# Patient Record
Sex: Female | Born: 1975 | Race: White | Hispanic: No | Marital: Married | Smoking: Former smoker
Health system: Southern US, Community
[De-identification: ages and names within clinical notes are randomized; demographics above are authoritative.]

## PROBLEM LIST (undated history)

## (undated) DIAGNOSIS — R51 Headache: Secondary | ICD-10-CM

## (undated) DIAGNOSIS — F32A Depression, unspecified: Secondary | ICD-10-CM

## (undated) DIAGNOSIS — E669 Obesity, unspecified: Secondary | ICD-10-CM

## (undated) DIAGNOSIS — F419 Anxiety disorder, unspecified: Secondary | ICD-10-CM

## (undated) DIAGNOSIS — N390 Urinary tract infection, site not specified: Secondary | ICD-10-CM

## (undated) DIAGNOSIS — K589 Irritable bowel syndrome without diarrhea: Secondary | ICD-10-CM

## (undated) DIAGNOSIS — F329 Major depressive disorder, single episode, unspecified: Secondary | ICD-10-CM

## (undated) DIAGNOSIS — M5431 Sciatica, right side: Secondary | ICD-10-CM

## (undated) HISTORY — DX: Irritable bowel syndrome, unspecified: K58.9

## (undated) HISTORY — DX: Sciatica, right side: M54.31

## (undated) HISTORY — PX: URETHRAL DILATION: SUR417

## (undated) HISTORY — DX: Depression, unspecified: F32.A

## (undated) HISTORY — DX: Urinary tract infection, site not specified: N39.0

## (undated) HISTORY — DX: Major depressive disorder, single episode, unspecified: F32.9

## (undated) HISTORY — DX: Obesity, unspecified: E66.9

## (undated) HISTORY — PX: CYSTO: SHX6284

---

## 2005-10-07 ENCOUNTER — Emergency Department (HOSPITAL_COMMUNITY): Admission: EM | Admit: 2005-10-07 | Discharge: 2005-10-08 | Payer: Self-pay | Admitting: Emergency Medicine

## 2010-02-13 ENCOUNTER — Ambulatory Visit (HOSPITAL_COMMUNITY): Admission: RE | Admit: 2010-02-13 | Discharge: 2010-02-13 | Payer: Self-pay | Admitting: Unknown Physician Specialty

## 2010-03-14 ENCOUNTER — Ambulatory Visit (HOSPITAL_COMMUNITY): Admission: RE | Admit: 2010-03-14 | Discharge: 2010-03-14 | Payer: Self-pay | Admitting: Unknown Physician Specialty

## 2010-04-26 ENCOUNTER — Ambulatory Visit (HOSPITAL_COMMUNITY): Admission: RE | Admit: 2010-04-26 | Discharge: 2010-04-26 | Payer: Self-pay | Admitting: Unknown Physician Specialty

## 2010-05-24 ENCOUNTER — Ambulatory Visit (HOSPITAL_COMMUNITY): Admission: RE | Admit: 2010-05-24 | Discharge: 2010-05-24 | Payer: Self-pay | Admitting: Unknown Physician Specialty

## 2011-01-30 ENCOUNTER — Ambulatory Visit: Payer: Self-pay

## 2011-04-19 HISTORY — PX: BACK SURGERY: SHX140

## 2011-04-22 ENCOUNTER — Emergency Department (HOSPITAL_COMMUNITY)
Admission: EM | Admit: 2011-04-22 | Discharge: 2011-04-23 | Discharge status: Against Medical Advice | Payer: 59 | Attending: Emergency Medicine | Admitting: Emergency Medicine

## 2011-04-22 DIAGNOSIS — M549 Dorsalgia, unspecified: Secondary | ICD-10-CM | POA: Insufficient documentation

## 2011-04-23 ENCOUNTER — Emergency Department: Payer: Self-pay | Admitting: Emergency Medicine

## 2011-05-08 ENCOUNTER — Observation Stay: Payer: Self-pay | Admitting: Unknown Physician Specialty

## 2013-03-22 ENCOUNTER — Encounter: Payer: Self-pay | Admitting: *Deleted

## 2013-03-23 ENCOUNTER — Encounter: Payer: Self-pay | Admitting: Family Medicine

## 2013-03-23 ENCOUNTER — Ambulatory Visit (INDEPENDENT_AMBULATORY_CARE_PROVIDER_SITE_OTHER): Payer: No Typology Code available for payment source | Admitting: Family Medicine

## 2013-03-23 VITALS — BP 108/78 | HR 80 | Temp 97.6°F | Wt 208.4 lb

## 2013-03-23 DIAGNOSIS — J36 Peritonsillar abscess: Secondary | ICD-10-CM

## 2013-03-23 DIAGNOSIS — S99921A Unspecified injury of right foot, initial encounter: Secondary | ICD-10-CM

## 2013-03-23 DIAGNOSIS — S99929A Unspecified injury of unspecified foot, initial encounter: Secondary | ICD-10-CM

## 2013-03-23 MED ORDER — CLINDAMYCIN HCL 300 MG PO CAPS: 300.0000 mg | ORAL_CAPSULE | Freq: Three times a day (TID) | ORAL | Status: DC

## 2013-03-23 NOTE — Progress Notes (Signed)
  Subjective:    Patient ID: Tiffany Townsend, female    DOB: September 06, 1976, 37 y.o.   MRN: [id number]  HPI Patient was walking at home and she struck her foot against a piece of furniture causing great toenail on the right side to become dislodged she relates pain discomfort she went to the ER they did not do an x-ray they told her just to clean and watch it. She comes here now because the toe appears to be dislodged in regards to the toenail.  Family history noncontributory Review of Systems  Has never had this problem before.no fevers or vomiting.    Objective:   Physical Exam Calf normal ankle normal foot normal great toe has a lucent toenail that is separated but is attached around the edges and at the base.  I explained to the patient her options she could watch this clean it daily and if it gets infected followup or we could go ahead and remove the toenail I explained how we would do this she consents.     Assessment & Plan:  Toenail removal-this was done understood sterile technique. Nerve block was placed in the great toe. The toenail was removed without difficulty bleeding was controlled a dressing was applied  Toe injury-I. Do not feel the patient needs an x-ray I did recommend clindamycin 300 mg 3 times a day for the next 5 days. Warning signs for infection were discussed followup if ongoing trouble.

## 2013-03-23 NOTE — Patient Instructions (Signed)
Warm soaks once a day Triple antibiotic ointment once daily Tylenol or ibuprofen as needed for pain

## 2013-06-10 ENCOUNTER — Telehealth: Payer: Self-pay | Admitting: Family Medicine

## 2013-06-10 NOTE — Telephone Encounter (Signed)
error 

## 2013-10-20 ENCOUNTER — Ambulatory Visit (INDEPENDENT_AMBULATORY_CARE_PROVIDER_SITE_OTHER): Payer: No Typology Code available for payment source | Admitting: Family Medicine

## 2013-10-20 ENCOUNTER — Encounter: Payer: Self-pay | Admitting: Family Medicine

## 2013-10-20 VITALS — BP 122/80 | Ht 64.0 in | Wt 210.4 lb

## 2013-10-20 DIAGNOSIS — L600 Ingrowing nail: Secondary | ICD-10-CM

## 2013-10-20 MED ORDER — SULFAMETHOXAZOLE-TMP DS 800-160 MG PO TABS: 1.0000 | ORAL_TABLET | Freq: Two times a day (BID) | ORAL | Status: DC

## 2013-10-20 NOTE — Progress Notes (Signed)
   Subjective:    Patient ID: Tiffany Townsend, female    DOB: 09/27/1976, 37 y.o.   MRN: [id number]  HPI  Patient arrives for an infected ingrown toe nail on right foot. Had same toe nail removed about a year ago was just coming back in good. Red, Swollen and very painful.   Tried saltwater, and local soaking  No fever or slight chills.  On further history he injured great toe some time ago. Female never grew back quite the same.  No discharge  Review of Systems No pain elsewhere no fever no headache no chest pain no shortness of breath ROS otherwise negative    Objective:   Physical Exam  Alert lungs clear. Heart regular in rhythm. H&T normal. Great toe positive erythema positive dystrophic nail no obvious discharge      Assessment & Plan:  Cellulitis with dystrophic nail and recurrent symptoms plan discussed at length. Appropriate antibiotics. Podiatry referral. [facility]

## 2014-02-25 ENCOUNTER — Encounter (HOSPITAL_COMMUNITY): Payer: Self-pay | Admitting: Emergency Medicine

## 2014-02-25 ENCOUNTER — Emergency Department (HOSPITAL_COMMUNITY)
Admission: EM | Admit: 2014-02-25 | Discharge: 2014-02-25 | Disposition: A | Discharge status: Home / Self Care | Payer: BC Managed Care – PPO | Attending: Emergency Medicine | Admitting: Emergency Medicine

## 2014-02-25 ENCOUNTER — Emergency Department (HOSPITAL_COMMUNITY): Payer: BC Managed Care – PPO

## 2014-02-25 DIAGNOSIS — R0602 Shortness of breath: Secondary | ICD-10-CM | POA: Insufficient documentation

## 2014-02-25 DIAGNOSIS — Z87891 Personal history of nicotine dependence: Secondary | ICD-10-CM | POA: Insufficient documentation

## 2014-02-25 DIAGNOSIS — R109 Unspecified abdominal pain: Secondary | ICD-10-CM

## 2014-02-25 DIAGNOSIS — Z79899 Other long term (current) drug therapy: Secondary | ICD-10-CM | POA: Insufficient documentation

## 2014-02-25 DIAGNOSIS — R1013 Epigastric pain: Secondary | ICD-10-CM | POA: Insufficient documentation

## 2014-02-25 DIAGNOSIS — R112 Nausea with vomiting, unspecified: Secondary | ICD-10-CM | POA: Insufficient documentation

## 2014-02-25 LAB — BASIC METABOLIC PANEL
BUN: 14 mg/dL (ref 6–23)
CO2: 23 mEq/L (ref 19–32)
Calcium: 10.2 mg/dL (ref 8.4–10.5)
Chloride: 100 mEq/L (ref 96–112)
Creatinine, Ser: 1.07 mg/dL (ref 0.50–1.10)
GFR calc Af Amer: 76 mL/min — ABNORMAL LOW (ref >90)
GFR, EST NON AFRICAN AMERICAN: 65 mL/min — AB (ref >90)
Glucose, Bld: 112 mg/dL — ABNORMAL HIGH (ref 70–99)
Potassium: 4.6 mEq/L (ref 3.7–5.3)
SODIUM: 137 meq/L (ref 137–147)

## 2014-02-25 LAB — HEPATIC FUNCTION PANEL
ALK PHOS: 59 U/L (ref 39–117)
ALT: 17 U/L (ref 0–35)
AST: 16 U/L (ref 0–37)
Albumin: 3.9 g/dL (ref 3.5–5.2)
Total Bilirubin: 0.7 mg/dL (ref 0.3–1.2)
Total Protein: 7.2 g/dL (ref 6.0–8.3)

## 2014-02-25 LAB — CBC
HCT: 42.5 % (ref 36.0–46.0)
Hemoglobin: 14.7 g/dL (ref 12.0–15.0)
MCH: 29.7 pg (ref 26.0–34.0)
MCHC: 34.6 g/dL (ref 30.0–36.0)
MCV: 85.9 fL (ref 78.0–100.0)
Platelets: 226 10*3/uL (ref 150–400)
RBC: 4.95 MIL/uL (ref 3.87–5.11)
RDW: 13.4 % (ref 11.5–15.5)
WBC: 8 10*3/uL (ref 4.0–10.5)

## 2014-02-25 LAB — TROPONIN I
Troponin I: <0.3 ng/mL (ref <0.30)
Troponin I: <0.3 ng/mL (ref <0.30)

## 2014-02-25 LAB — D-DIMER, QUANTITATIVE: D-Dimer, Quant: 0.39 ug/mL-FEU (ref 0.00–0.48)

## 2014-02-25 LAB — LIPASE, BLOOD: Lipase: 40 U/L (ref 11–59)

## 2014-02-25 MED ORDER — PANTOPRAZOLE SODIUM 20 MG PO TBEC: 20.0000 mg | DELAYED_RELEASE_TABLET | Freq: Every day | ORAL | Status: DC

## 2014-02-25 MED ORDER — GI COCKTAIL ~~LOC~~
30.0000 mL | Freq: Once | ORAL | Status: AC
  Administered 2014-02-25: 30 mL via ORAL
  Filled 2014-02-25: qty 30

## 2014-02-25 MED ORDER — OXYCODONE-ACETAMINOPHEN 5-325 MG PO TABS: 1.0000 | ORAL_TABLET | Freq: Four times a day (QID) | ORAL | Status: DC | PRN

## 2014-02-25 MED ORDER — PANTOPRAZOLE SODIUM 40 MG IV SOLR
40.0000 mg | Freq: Once | INTRAVENOUS | Status: AC
  Administered 2014-02-25: 40 mg via INTRAVENOUS
  Filled 2014-02-25: qty 40

## 2014-02-25 MED ORDER — MORPHINE SULFATE 4 MG/ML IJ SOLN
4.0000 mg | Freq: Once | INTRAMUSCULAR | Status: AC
  Administered 2014-02-25: 4 mg via INTRAVENOUS
  Filled 2014-02-25: qty 1

## 2014-02-25 MED ORDER — ONDANSETRON HCL 4 MG/2ML IJ SOLN
4.0000 mg | Freq: Once | INTRAMUSCULAR | Status: AC
  Administered 2014-02-25: 4 mg via INTRAVENOUS
  Filled 2014-02-25: qty 2

## 2014-02-25 MED ORDER — HYDROMORPHONE HCL PF 1 MG/ML IJ SOLN
1.0000 mg | Freq: Once | INTRAMUSCULAR | Status: AC
  Administered 2014-02-25: 1 mg via INTRAVENOUS
  Filled 2014-02-25: qty 1

## 2014-02-25 MED ORDER — ONDANSETRON 4 MG PO TBDP: ORAL_TABLET | ORAL | Status: DC

## 2014-02-25 NOTE — ED Notes (Signed)
Pt reports that her pain is coming back.  Notified edp

## 2014-02-25 NOTE — ED Provider Notes (Signed)
CSN: [id number]     Arrival date & time 02/25/14  0233 History  This chart was scribed for Tiffany Diego, MD by Ludger Nutting, ED Scribe. This patient was seen in room APA18/AP[facility]18 and the patient's care was started 7:35 AM.    Chief Complaint  Patient presents with  . Chest Pain  . Shortness of Breath      Patient is a 38 y.o. female presenting with chest pain. The history is provided by the patient. No language interpreter was used.  Chest Pain Pain location:  Epigastric Pain quality: sharp   Pain radiates to:  Does not radiate Pain radiates to the back: no   Pain severity:  Moderate Onset quality:  Sudden Duration:  5 hours Timing:  Constant Progression:  Unchanged Relieved by:  Rest Associated symptoms: nausea, shortness of breath and vomiting   Associated symptoms: no abdominal pain, no back pain and no fatigue     HPI Comments: JIM PHILEMON is a 38 y.o. female who presents to the Emergency Department complaining of sudden onset, constant, unchanged mid chest pain that began 4.5 hours ago. She reports associated SOB, nausea, 5-6 episodes of vomiting. She describes the chest pain as sharp. She denies abdominal pain.   History reviewed. No pertinent past medical history. Past Surgical History  Procedure Laterality Date  . Back surgery  04/2011   No family history on file. History  Substance Use Topics  . Smoking status: Former Research scientist (life sciences)  . Smokeless tobacco: Not on file  . Alcohol Use: No   OB History   Grav Para Term Preterm Abortions TAB SAB Ect Mult Living                 Review of Systems  Constitutional: Negative for appetite change and fatigue.  HENT: Negative for congestion, ear discharge and sinus pressure.   Eyes: Negative for discharge.  Respiratory: Positive for shortness of breath.   Cardiovascular: Positive for chest pain.  Gastrointestinal: Positive for nausea and vomiting. Negative for abdominal pain and diarrhea.  Genitourinary: Negative for  frequency and hematuria.  Musculoskeletal: Negative for back pain.  Neurological: Negative for seizures.  Psychiatric/Behavioral: Negative for hallucinations.      Allergies  Codeine  Home Medications   Current Outpatient Rx  Name  Route  Sig  Dispense  Refill  . sulfamethoxazole-trimethoprim (BACTRIM DS) 800-160 MG per tablet   Oral   Take 1 tablet by mouth 2 (two) times daily.   20 tablet   0    BP 140/83  Pulse 104  Temp(Src) 97.8 F (36.6 C) (Oral)  Resp 24  Ht 5\' 4"  (1.626 m)  Wt 215 lb (97.523 kg)  BMI 36.89 kg/m2  SpO2 100%  LMP 02/11/2014 Physical Exam  Nursing note and vitals reviewed. Constitutional: She is oriented to person, place, and time. She appears well-developed.  HENT:  Head: Normocephalic.  Eyes: Conjunctivae and EOM are normal. No scleral icterus.  Neck: Neck supple. No thyromegaly present.  Cardiovascular: Normal rate, regular rhythm and normal heart sounds.  Exam reveals no gallop and no friction rub.   No murmur heard. Pulmonary/Chest: Effort normal and breath sounds normal. No stridor. No respiratory distress. She has no wheezes. She has no rales. She exhibits no tenderness.  Abdominal: She exhibits no distension. There is no tenderness. There is no rebound.  Musculoskeletal: Normal range of motion. She exhibits no edema.  Lymphadenopathy:    She has no cervical adenopathy.  Neurological: She is oriented  to person, place, and time. She exhibits normal muscle tone. Coordination normal.  Skin: No rash noted. No erythema.  Psychiatric: She has a normal mood and affect. Her behavior is normal.    ED Course  Procedures (including critical care time) DIAGNOSTIC STUDIES: Oxygen Saturation is 100% on RA, normal by my interpretation.    COORDINATION OF CARE: 7:36 AM Discussed treatment plan with pt at bedside and pt agreed to plan.   Medications  gi cocktail (Maalox,Lidocaine,Donnatal) (not administered)  HYDROmorphone (DILAUDID) injection  1 mg (1 mg Intravenous Given 02/25/14 0753)  ondansetron (ZOFRAN) injection 4 mg (4 mg Intravenous Given 02/25/14 0753)  pantoprazole (PROTONIX) injection 40 mg (40 mg Intravenous Given 02/25/14 0753)  morphine 4 MG/ML injection 4 mg (4 mg Intravenous Given 02/25/14 0858)      Labs Review Labs Reviewed  BASIC METABOLIC PANEL - Abnormal; Notable for the following:    Glucose, Bld 112 (*)    GFR calc non Af Amer 65 (*)    GFR calc Af Amer 76 (*)    All other components within normal limits  CBC  TROPONIN I  LIPASE, BLOOD   Imaging Review Dg Chest 2 View  02/25/2014   CLINICAL DATA:  Shortness of breath and chest pain  EXAM: CHEST  2 VIEW  COMPARISON:  None.  FINDINGS: The heart size and mediastinal contours are within normal limits. Both lungs are clear. The visualized skeletal structures are unremarkable.  IMPRESSION: No active cardiopulmonary disease.   Electronically Signed   By: Inez Catalina M.D.   On: 02/25/2014 07:19     EKG Interpretation   Date/Time:  Friday February 25 2014 06:36:56 EDT Ventricular Rate:  103 PR Interval:  146 QRS Duration: 90 QT Interval:  346 QTC Calculation: 453 R Axis:   -46 Text Interpretation:  Sinus tachycardia Left axis deviation Pulmonary  disease pattern Abnormal ECG No previous ECGs available Confirmed by  CAMPOS  MD, Lennette Bihari ([id number]) on 02/25/2014 6:44:27 AM      MDM   Final diagnoses:  None   The chart was scribed for me under my direct supervision.  I personally performed the history, physical, and medical decision making and all procedures in the evaluation of this patient.Tiffany Diego, MD 02/25/14 [phone]

## 2014-02-25 NOTE — ED Notes (Signed)
Pt awoke from sleep with sharp mid sternal chest pain approx 3 am.  Pt also reports sob with same

## 2014-02-25 NOTE — ED Notes (Signed)
Pt c/o pain 8/10.  Notified edp

## 2014-02-25 NOTE — Discharge Instructions (Signed)
Follow up with your family md next week. °

## 2014-02-28 ENCOUNTER — Ambulatory Visit (INDEPENDENT_AMBULATORY_CARE_PROVIDER_SITE_OTHER): Payer: BC Managed Care – PPO | Admitting: Family Medicine

## 2014-02-28 ENCOUNTER — Encounter: Payer: Self-pay | Admitting: Family Medicine

## 2014-02-28 VITALS — BP 112/80 | Temp 98.7°F | Ht 64.0 in | Wt 212.0 lb

## 2014-02-28 DIAGNOSIS — K810 Acute cholecystitis: Secondary | ICD-10-CM

## 2014-02-28 NOTE — Progress Notes (Signed)
   Subjective:    Patient ID: Tiffany Townsend, female    DOB: [birth year], 38 y.o.   MRN: [id number]  HPI Patient was seen at [facility] ER on Friday for chest pain. EKG was normal. Ultrasound was done and gallstones was found. Possible surgical intervention. No other concerns at this time.   Patient relates pain awoke her from her sleep she described as being epigastric pain and chest pain she denies any sweats chills nausea vomiting she went to the ER they did a thorough workup showed gallstones they told her to followup here patient still hurting over the weekend with epigastric chest pain and upper abdominal pain she relates having difficult time eating and drinking because of this she is using oxycodone but is not using her Zofran, occasional vomiting, she is urinating she is having some stools Review of Systems ER labs EKG ultrasound all reviewed and Patient positive for nausea upper abdominal pain see above    Objective:   Physical Exam  Lungs clear hearts regular flanks nontender abdomen moderate mid abdomen right upper quadrant and epigastric tenderness and discomfort. No guarding or rebound     Assessment & Plan:  I discussed the case with Dr. Arnoldo Morale. He will see her tomorrow morning at his office. She will continue oxycodone for pain cautioned drowsiness I encourage her to use Zofran for the nausea stick with a bland diet she will probably have surgery later this week if she starts having bilious vomiting high fevers severe pain or worse go to the ER

## 2014-03-01 ENCOUNTER — Encounter (HOSPITAL_COMMUNITY): Payer: Self-pay

## 2014-03-01 ENCOUNTER — Encounter (HOSPITAL_COMMUNITY)
Admission: RE | Admit: 2014-03-01 | Discharge: 2014-03-01 | Disposition: A | Discharge status: Home / Self Care | Payer: BC Managed Care – PPO | Source: Ambulatory Visit | Attending: General Surgery | Admitting: General Surgery

## 2014-03-01 NOTE — Pre-Procedure Instructions (Signed)
Preop phone call

## 2014-03-02 ENCOUNTER — Encounter (HOSPITAL_COMMUNITY): Payer: BC Managed Care – PPO | Admitting: Anesthesiology

## 2014-03-02 ENCOUNTER — Encounter (HOSPITAL_COMMUNITY)
Admission: RE | Disposition: A | Discharge status: Home / Self Care | Payer: Self-pay | Source: Ambulatory Visit | Attending: General Surgery

## 2014-03-02 ENCOUNTER — Ambulatory Visit (HOSPITAL_COMMUNITY)
Admission: RE | Admit: 2014-03-02 | Discharge: 2014-03-02 | Disposition: A | Discharge status: Home / Self Care | Payer: BC Managed Care – PPO | Source: Ambulatory Visit | Attending: General Surgery | Admitting: General Surgery

## 2014-03-02 ENCOUNTER — Encounter (HOSPITAL_COMMUNITY): Payer: Self-pay | Admitting: *Deleted

## 2014-03-02 ENCOUNTER — Ambulatory Visit (HOSPITAL_COMMUNITY): Payer: BC Managed Care – PPO | Admitting: Anesthesiology

## 2014-03-02 DIAGNOSIS — Z87891 Personal history of nicotine dependence: Secondary | ICD-10-CM | POA: Insufficient documentation

## 2014-03-02 DIAGNOSIS — K821 Hydrops of gallbladder: Secondary | ICD-10-CM | POA: Insufficient documentation

## 2014-03-02 DIAGNOSIS — K8010 Calculus of gallbladder with chronic cholecystitis without obstruction: Secondary | ICD-10-CM | POA: Insufficient documentation

## 2014-03-02 HISTORY — PX: CHOLECYSTECTOMY: SHX55

## 2014-03-02 HISTORY — DX: Headache: R51

## 2014-03-02 HISTORY — DX: Anxiety disorder, unspecified: F41.9

## 2014-03-02 LAB — PREGNANCY, URINE: Preg Test, Ur: NEGATIVE

## 2014-03-02 SURGERY — LAPAROSCOPIC CHOLECYSTECTOMY
Anesthesia: General | Site: Abdomen

## 2014-03-02 MED ORDER — LACTATED RINGERS IV SOLN
INTRAVENOUS | Status: DC
  Administered 2014-03-02 (×2): via INTRAVENOUS

## 2014-03-02 MED ORDER — GLYCOPYRROLATE 0.2 MG/ML IJ SOLN
INTRAMUSCULAR | Status: AC
  Filled 2014-03-02: qty 2

## 2014-03-02 MED ORDER — MIDAZOLAM HCL 2 MG/2ML IJ SOLN
INTRAMUSCULAR | Status: AC
  Filled 2014-03-02: qty 2

## 2014-03-02 MED ORDER — MIDAZOLAM HCL 2 MG/2ML IJ SOLN
1.0000 mg | INTRAMUSCULAR | Status: AC | PRN
  Administered 2014-03-02 (×3): 2 mg via INTRAVENOUS

## 2014-03-02 MED ORDER — PROPOFOL 10 MG/ML IV BOLUS
INTRAVENOUS | Status: DC | PRN
  Administered 2014-03-02: 150 mg via INTRAVENOUS
  Administered 2014-03-02 (×3): 50 mg via INTRAVENOUS

## 2014-03-02 MED ORDER — ONDANSETRON HCL 4 MG/2ML IJ SOLN
4.0000 mg | Freq: Once | INTRAMUSCULAR | Status: AC
  Administered 2014-03-02: 4 mg via INTRAVENOUS

## 2014-03-02 MED ORDER — GLYCOPYRROLATE 0.2 MG/ML IJ SOLN
INTRAMUSCULAR | Status: AC
  Filled 2014-03-02: qty 1

## 2014-03-02 MED ORDER — BUPIVACAINE HCL (PF) 0.5 % IJ SOLN
INTRAMUSCULAR | Status: AC
  Filled 2014-03-02: qty 30

## 2014-03-02 MED ORDER — KETOROLAC TROMETHAMINE 30 MG/ML IJ SOLN
30.0000 mg | Freq: Once | INTRAMUSCULAR | Status: AC
  Administered 2014-03-02: 30 mg via INTRAVENOUS
  Filled 2014-03-02: qty 1

## 2014-03-02 MED ORDER — NEOSTIGMINE METHYLSULFATE 1 MG/ML IJ SOLN
INTRAMUSCULAR | Status: DC | PRN
  Administered 2014-03-02: 2 mg via INTRAVENOUS
  Administered 2014-03-02: 1 mg via INTRAVENOUS

## 2014-03-02 MED ORDER — ONDANSETRON HCL 4 MG/2ML IJ SOLN: 4.0000 mg | Freq: Once | INTRAMUSCULAR | Status: DC | PRN

## 2014-03-02 MED ORDER — CIPROFLOXACIN IN D5W 400 MG/200ML IV SOLN
INTRAVENOUS | Status: AC
  Filled 2014-03-02: qty 200

## 2014-03-02 MED ORDER — FENTANYL CITRATE 0.05 MG/ML IJ SOLN
INTRAMUSCULAR | Status: AC
Start: 2014-03-02 — End: 2014-03-02
  Filled 2014-03-02: qty 5

## 2014-03-02 MED ORDER — LIDOCAINE HCL (CARDIAC) 10 MG/ML IV SOLN
INTRAVENOUS | Status: DC | PRN
  Administered 2014-03-02: 20 mg via INTRAVENOUS

## 2014-03-02 MED ORDER — ONDANSETRON HCL 4 MG/2ML IJ SOLN
INTRAMUSCULAR | Status: AC
  Filled 2014-03-02: qty 2

## 2014-03-02 MED ORDER — POVIDONE-IODINE 10 % OINT PACKET
TOPICAL_OINTMENT | CUTANEOUS | Status: DC | PRN
  Administered 2014-03-02: 1 via TOPICAL

## 2014-03-02 MED ORDER — LIDOCAINE HCL (PF) 1 % IJ SOLN
INTRAMUSCULAR | Status: AC
  Filled 2014-03-02: qty 5

## 2014-03-02 MED ORDER — PROPOFOL 10 MG/ML IV EMUL
INTRAVENOUS | Status: AC
  Filled 2014-03-02: qty 20

## 2014-03-02 MED ORDER — BUPIVACAINE HCL (PF) 0.5 % IJ SOLN
INTRAMUSCULAR | Status: DC | PRN
  Administered 2014-03-02: 10 mL

## 2014-03-02 MED ORDER — ROCURONIUM BROMIDE 100 MG/10ML IV SOLN
INTRAVENOUS | Status: DC | PRN
  Administered 2014-03-02: 40 mg via INTRAVENOUS

## 2014-03-02 MED ORDER — GLYCOPYRROLATE 0.2 MG/ML IJ SOLN
0.2000 mg | Freq: Once | INTRAMUSCULAR | Status: AC
  Administered 2014-03-02: 0.2 mg via INTRAVENOUS

## 2014-03-02 MED ORDER — POVIDONE-IODINE 10 % EX OINT
TOPICAL_OINTMENT | CUTANEOUS | Status: AC
  Filled 2014-03-02: qty 1

## 2014-03-02 MED ORDER — HYDROCODONE-ACETAMINOPHEN 5-325 MG PO TABS: 1.0000 | ORAL_TABLET | Freq: Four times a day (QID) | ORAL | Status: DC | PRN

## 2014-03-02 MED ORDER — FENTANYL CITRATE 0.05 MG/ML IJ SOLN
INTRAMUSCULAR | Status: AC
  Filled 2014-03-02: qty 5

## 2014-03-02 MED ORDER — FENTANYL CITRATE 0.05 MG/ML IJ SOLN
25.0000 ug | INTRAMUSCULAR | Status: DC | PRN
  Administered 2014-03-02 (×2): 50 ug via INTRAVENOUS
  Filled 2014-03-02: qty 2

## 2014-03-02 MED ORDER — GLYCOPYRROLATE 0.2 MG/ML IJ SOLN
INTRAMUSCULAR | Status: DC | PRN
  Administered 2014-03-02: 0.4 mg via INTRAVENOUS
  Administered 2014-03-02: 0.2 mg via INTRAVENOUS

## 2014-03-02 MED ORDER — HEMOSTATIC AGENTS (NO CHARGE) OPTIME
TOPICAL | Status: DC | PRN
  Administered 2014-03-02: 2 via TOPICAL

## 2014-03-02 MED ORDER — FENTANYL CITRATE 0.05 MG/ML IJ SOLN
INTRAMUSCULAR | Status: DC | PRN
  Administered 2014-03-02: 100 ug via INTRAVENOUS
  Administered 2014-03-02 (×2): 50 ug via INTRAVENOUS
  Administered 2014-03-02: 100 ug via INTRAVENOUS
  Administered 2014-03-02 (×3): 50 ug via INTRAVENOUS

## 2014-03-02 MED ORDER — SODIUM CHLORIDE 0.9 % IR SOLN
Status: DC | PRN
  Administered 2014-03-02: 1000 mL

## 2014-03-02 MED ORDER — CIPROFLOXACIN IN D5W 400 MG/200ML IV SOLN
400.0000 mg | INTRAVENOUS | Status: AC
  Administered 2014-03-02: 400 mg via INTRAVENOUS

## 2014-03-02 MED ORDER — ROCURONIUM BROMIDE 50 MG/5ML IV SOLN
INTRAVENOUS | Status: AC
  Filled 2014-03-02: qty 1

## 2014-03-02 SURGICAL SUPPLY — 43 items
APPLIER CLIP LAPSCP 10X32 DD (CLIP) ×3 IMPLANT
BAG HAMPER (MISCELLANEOUS) ×3 IMPLANT
CLOTH BEACON ORANGE TIMEOUT ST (SAFETY) ×3 IMPLANT
COVER LIGHT HANDLE STERIS (MISCELLANEOUS) ×6 IMPLANT
DECANTER SPIKE VIAL GLASS SM (MISCELLANEOUS) ×3 IMPLANT
DURAPREP 26ML APPLICATOR (WOUND CARE) ×3 IMPLANT
ELECT REM PT RETURN 9FT ADLT (ELECTROSURGICAL) ×3
ELECTRODE REM PT RTRN 9FT ADLT (ELECTROSURGICAL) ×1 IMPLANT
FILTER SMOKE EVAC LAPAROSHD (FILTER) ×3 IMPLANT
FORMALIN 10 PREFIL 120ML (MISCELLANEOUS) ×3 IMPLANT
GLOVE BIO SURGEON STRL SZ7.5 (GLOVE) ×3 IMPLANT
GLOVE BIOGEL PI IND STRL 7.0 (GLOVE) ×2 IMPLANT
GLOVE BIOGEL PI IND STRL 8 (GLOVE) ×1 IMPLANT
GLOVE BIOGEL PI INDICATOR 7.0 (GLOVE) ×4
GLOVE BIOGEL PI INDICATOR 8 (GLOVE) ×2
GLOVE ECLIPSE 6.5 STRL STRAW (GLOVE) ×3 IMPLANT
GLOVE ECLIPSE 7.0 STRL STRAW (GLOVE) ×3 IMPLANT
GLOVE EXAM NITRILE LRG STRL (GLOVE) ×3 IMPLANT
GOWN STRL REUS W/TWL LRG LVL3 (GOWN DISPOSABLE) ×9 IMPLANT
HEMOSTAT SNOW SURGICEL 2X4 (HEMOSTASIS) ×6 IMPLANT
INST SET LAPROSCOPIC AP (KITS) ×6 IMPLANT
IV NS IRRIG 3000ML ARTHROMATIC (IV SOLUTION) ×3 IMPLANT
KIT ROOM TURNOVER APOR (KITS) ×3 IMPLANT
MANIFOLD NEPTUNE II (INSTRUMENTS) ×3 IMPLANT
NEEDLE INSUFFLATION 14GA 120MM (NEEDLE) ×3 IMPLANT
NS IRRIG 1000ML POUR BTL (IV SOLUTION) ×3 IMPLANT
PACK LAP CHOLE LZT030E (CUSTOM PROCEDURE TRAY) ×3 IMPLANT
PAD ARMBOARD 7.5X6 YLW CONV (MISCELLANEOUS) ×3 IMPLANT
POUCH SPECIMEN RETRIEVAL 10MM (ENDOMECHANICALS) ×3 IMPLANT
SET BASIN LINEN APH (SET/KITS/TRAYS/PACK) ×3 IMPLANT
SET TUBE IRRIG SUCTION NO TIP (IRRIGATION / IRRIGATOR) ×3 IMPLANT
SLEEVE ENDOPATH XCEL 5M (ENDOMECHANICALS) ×3 IMPLANT
SPONGE GAUZE 2X2 8PLY STER LF (GAUZE/BANDAGES/DRESSINGS) ×4
SPONGE GAUZE 2X2 8PLY STRL LF (GAUZE/BANDAGES/DRESSINGS) ×8 IMPLANT
STAPLER VISISTAT (STAPLE) ×3 IMPLANT
SUT VICRYL 0 UR6 27IN ABS (SUTURE) ×6 IMPLANT
TAPE CLOTH SURG 4X10 WHT LF (GAUZE/BANDAGES/DRESSINGS) ×3 IMPLANT
TROCAR ENDO BLADELESS 11MM (ENDOMECHANICALS) ×3 IMPLANT
TROCAR XCEL NON-BLD 5MMX100MML (ENDOMECHANICALS) ×3 IMPLANT
TROCAR XCEL UNIV SLVE 11M 100M (ENDOMECHANICALS) ×3 IMPLANT
TUBING INSUFFLATION (TUBING) ×3 IMPLANT
WARMER LAPAROSCOPE (MISCELLANEOUS) ×3 IMPLANT
YANKAUER SUCT 12FT TUBE ARGYLE (SUCTIONS) ×3 IMPLANT

## 2014-03-02 NOTE — H&P (Signed)
  NTS SOAP Note  Vital Signs:  Vitals as of: 2/97/9892: Systolic 119: Diastolic 90: Heart Rate 96: Temp 97.17F: Height 3ft 4in: Weight 212Lbs 0 Ounces: Pain Level 5: BMI 36.39  BMI : 36.39 kg/m2  Subjective: This 41 Years 16 Months old Female presents for of abdominal pain.  Has been having intermittent right upper quadrant abdominal pain, nausea, and food intolerance since last weekend.  No fever, chills, jaundice.  U/S of gallbladder shows cholelithiasis, normal common bile duct.  Review of Symptoms:  Constitutional:  fatigue    dizzy spells Eyes:unremarkable   Nose/Mouth/Throat:unremarkable Cardiovascular:  unremarkable   Respiratory:unremarkable   Gastrointestin    abdominal pain,nausea,heartburn Genitourinary:    frequency   back pain Skin:unremarkable Hematolgic/Lymphatic:unremarkable     Allergic/Immunologic:unremarkable     Past Medical History:    Reviewed  Past Medical History  Surgical History: back surgery Medical Problems: none Allergies: codeine Medications: zofran, percocet, protonix   Social History:Reviewed  Social History  Preferred Language: English Race:  White Ethnicity: Not Hispanic / Latino Age: 62 Years 8 Months Marital Status:  M Alcohol: no Recreational drug(s): no   Smoking Status: Never smoker reviewed on 03/01/2014 Functional Status reviewed on 03/01/2014 ------------------------------------------------ Bathing: Normal Cooking: Normal Dressing: Normal Driving: Normal Eating: Normal Managing Meds: Normal Oral Care: Normal Shopping: Normal Toileting: Normal Transferring: Normal Walking: Normal Cognitive Status reviewed on 03/01/2014 ------------------------------------------------ Attention: Normal Decision Making: Normal Language: Normal Memory: Normal Motor: Normal Perception: Normal Problem Solving: Normal Visual and Spatial: Normal   Family History:  Reviewed  Family  Health History Mother, Living; Diabetes mellitus, unspecified type;  Father, Living; Healthy;     Objective Information: General:  Well appearing, well nourished in no distress.   no scleral icterus Heart:  RRR, no murmur Lungs:    CTA bilaterally, no wheezes, rhonchi, rales.  Breathing unlabored. Abdomen:Soft, tender in right upper quadrant to palpation, ND, normal bowel sounds, no HSM, no masses.  No peritoneal signs.  Assessment:Cholecystitis, cholelithiasis  Diagnoses: 574.00 Calculus of gallbladder with acute cholecystitis (Calculus of gallbladder with acute cholecystitis without obstruction)  Procedures: 41740 - OFFICE OUTPATIENT NEW 30 MINUTES    Plan:  Scheduled for laparoscopic cholecystectomy on 03/02/14.   Patient Education:Alternative treatments to surgery were discussed with patient (and family).  Risks and benefits  including bleeding, infection, hepatobiliary injury, and the possibility of an open procedure were fully explained to the patient (and family) who gave informed consent. Patient/family questions were addressed.  Follow-up:Pending Surgery

## 2014-03-02 NOTE — Interval H&P Note (Signed)
History and Physical Interval Note:  03/02/2014 10:34 AM  Tiffany Townsend  has presented today for surgery, with the diagnosis of cholelithiasis  The various methods of treatment have been discussed with the patient and family. After consideration of risks, benefits and other options for treatment, the patient has consented to  Procedure(s): LAPAROSCOPIC CHOLECYSTECTOMY (N/A) as a surgical intervention .  The patient's history has been reviewed, patient examined, no change in status, stable for surgery.  I have reviewed the patient's chart and labs.  Questions were answered to the patient's satisfaction.     [name]

## 2014-03-02 NOTE — Anesthesia Procedure Notes (Signed)
Procedure Name: Intubation Date/Time: 03/02/2014 11:36 AM Performed by: Vista Deck Pre-anesthesia Checklist: Patient identified, Patient being monitored, Timeout performed, Emergency Drugs available and Suction available Patient Re-evaluated:Patient Re-evaluated prior to inductionOxygen Delivery Method: Circle System Utilized Preoxygenation: Pre-oxygenation with 100% oxygen Intubation Type: IV induction Ventilation: Mask ventilation without difficulty Laryngoscope Size: 3 and Mac Grade View: Grade I Tube type: Oral Tube size: 7.0 mm Number of attempts: 1 Airway Equipment and Method: stylet Placement Confirmation: ETT inserted through vocal cords under direct vision,  positive ETCO2 and breath sounds checked- equal and bilateral Secured at: 21 cm Tube secured with: Tape Dental Injury: Teeth and Oropharynx as per pre-operative assessment

## 2014-03-02 NOTE — Anesthesia Postprocedure Evaluation (Signed)
Anesthesia Post Note  Patient: Tiffany Townsend  Procedure(s) Performed: Procedure(s) (LRB): LAPAROSCOPIC CHOLECYSTECTOMY (N/A)  Anesthesia type: General  Patient location: PACU  Post pain: Pain level controlled  Post assessment: Post-op Vital signs reviewed, Patient's Cardiovascular Status Stable, Respiratory Function Stable, Patent Airway, No signs of Nausea or vomiting and Pain level controlled  Last Vitals:  Filed Vitals:   03/02/14 1240  BP: 122/76  Pulse: 108  Temp: 36.5 C  Resp: 15    Post vital signs: Reviewed and stable  Level of consciousness: awake and alert   Complications: No apparent anesthesia complications

## 2014-03-02 NOTE — Op Note (Signed)
Patient:  Tiffany Townsend  DOB:  1976/10/24  MRN:  [id number]   Preop Diagnosis:  Cholecystitis, cholelithiasis  Postop Diagnosis:  Same, hydrops of gallbladder  Procedure:  Laparoscopic cholecystectomy  Surgeon:  Aviva Signs, M.D.  Anes:  General endotracheal  Indications:  Patient is a 38 year old white female presents with cholecystitis secondary to cholelithiasis. The risks and benefits of the procedure including bleeding, infection, hepatobiliary injury, and the possibility of an open procedure were fully explained to the patient, who gave informed consent.  Procedure note:  The patient is placed the supine position. After induction of general endotracheal anesthesia, the abdomen was prepped and draped using usual sterile technique with DuraPrep. Surgical site confirmation was performed.  A supraumbilical incision was made down to the fascia. A Veress needle was introduced into the abdominal cavity and confirmation of placement was done using the saline drop test. The abdomen was then insufflated to 16 mm mercury pressure. An 11 mm trocar was introduced into the abdominal cavity under direct visualization without difficulty. The patient was placed in reverse Trendelenburg position and additional 11 mm trocar was placed the epigastric region and 5 mm trochars were placed the right upper quadrant and right flank regions. The liver was inspected and noted to be somewhat boggy in nature. The gallbladder was distended, tense, with a thickened gallbladder wall. In order to expose the infundibulum of the gallbladder, the gallbladder was decompressed with suction. Hydrops of the gallbladder was found. The gallbladder was then retracted in a dynamic fashion in order to expose the triangle of Calot. The cystic duct was first identified. Its juncture to the infundibulum was fully identified. Endoclips were placed proximally and distally on the cystic duct, and the cystic duct was divided. This was  likewise done the cystic artery. The gallbladder was then freed away from the gallbladder fossa using Bovie electrocautery. The gallbladder was delivered through the epigastric trocar site using an Endo Catch bag. The gallbladder fossa was inspected no abnormal bleeding or bile leakage was noted. Surgicel is placed the gallbladder fossa. All fluid and air were then evacuated from the abdominal cavity prior to removal of the trochars.  All wounds were irrigated with normal saline. All wounds were injected with 0.5% Sensorcaine. The suprabuccal fashion as well as epigastric fascia reapproximated using 0 Vicryl interrupted sutures. All skin incisions were closed with staples. Betadine ointment and dry sterile dressings were applied.  All tape and needle counts were correct at the end of the procedure. Patient was extubated in the operating room and transferred to PACU in stable condition.  Complications:  None  EBL:  Less than 50 cc  Specimen:  Gallbladder

## 2014-03-02 NOTE — Transfer of Care (Signed)
Immediate Anesthesia Transfer of Care Note  Patient: Tiffany Townsend  Procedure(s) Performed: Procedure(s) (LRB): LAPAROSCOPIC CHOLECYSTECTOMY (N/A)  Patient Location: PACU  Anesthesia Type: General  Level of Consciousness: awake  Airway & Oxygen Therapy: Patient Spontanous Breathing and non-rebreather face mask  Post-op Assessment: Report given to PACU RN, Post -op Vital signs reviewed and stable and Patient moving all extremities  Post vital signs: Reviewed and stable  Complications: No apparent anesthesia complications

## 2014-03-02 NOTE — Discharge Instructions (Signed)
Laparoscopic Cholecystectomy, Care After °Refer to this sheet in the next few weeks. These instructions provide you with information on caring for yourself after your procedure. Your health care provider may also give you more specific instructions. Your treatment has been planned according to current medical practices, but problems sometimes occur. Call your health care provider if you have any problems or questions after your procedure. °WHAT TO EXPECT AFTER THE PROCEDURE °After your procedure, it is typical to have the following: °· Pain at your incision sites. You will be given pain medicines to control the pain. °· Mild nausea or vomiting. This should improve after the first 24 hours. °· Bloating and possibly shoulder pain from the gas used during the procedure. This will improve after the first 24 hours. °HOME CARE INSTRUCTIONS  °· Change bandages (dressings) as directed by your health care provider. °· Keep the wound dry and clean. You may wash the wound gently with soap and water. Gently blot or dab the area dry. °· Do not take baths or use swimming pools or hot tubs for 2 weeks or until your health care provider approves. °· Only take over-the-counter or prescription medicines as directed by your health care provider. °· Continue your normal diet as directed by your health care provider. °· Do not lift anything heavier than 10 pounds (4.5 kg) until your health care provider approves. °· Do not play contact sports for 1 week or until your health care provider approves. °SEEK MEDICAL CARE IF:  °· You have redness, swelling, or increasing pain in the wound. °· You notice yellowish-white fluid (pus) coming from the wound. °· You have drainage from the wound that lasts longer than 1 day. °· You notice a bad smell coming from the wound or dressing. °· Your surgical cuts (incisions) break open. °SEEK IMMEDIATE MEDICAL CARE IF:  °· You develop a rash. °· You have difficulty breathing. °· You have chest pain. °· You  have a fever. °· You have increasing pain in the shoulders (shoulder strap areas). °· You have dizzy episodes or faint while standing. °· You have severe abdominal pain. °· You feel sick to your stomach (nauseous) or throw up (vomit) and this lasts for more than 1 day. °Document Released: 11/04/2005 Document Revised: 08/25/2013 Document Reviewed: 06/16/2013 °[employer]® Patient Information ©2014 [employer]. ° °

## 2014-03-02 NOTE — Anesthesia Preprocedure Evaluation (Addendum)
Anesthesia Evaluation  Patient identified by MRN, date of birth, ID band Patient awake    Reviewed: Allergy & Precautions, H&P , NPO status , Patient's Chart, lab work & pertinent test results  Airway Mallampati: II TM Distance: >3 FB     Dental  (+) Teeth Intact   Pulmonary former smoker,  breath sounds clear to auscultation        Cardiovascular negative cardio ROS  Rhythm:Regular Rate:Normal     Neuro/Psych  Headaches, Anxiety    GI/Hepatic negative GI ROS,   Endo/Other    Renal/GU      Musculoskeletal   Abdominal   Peds  Hematology   Anesthesia Other Findings   Reproductive/Obstetrics                          Anesthesia Physical Anesthesia Plan  ASA: II  Anesthesia Plan: General   Post-op Pain Management:    Induction: Intravenous, Rapid sequence and Cricoid pressure planned  Airway Management Planned: Oral ETT  Additional Equipment:   Intra-op Plan:   Post-operative Plan: Extubation in OR  Informed Consent:   Plan Discussed with:   Anesthesia Plan Comments:         Anesthesia Quick Evaluation

## 2014-03-03 MED ORDER — SODIUM CHLORIDE 0.9 % IR SOLN
Status: DC | PRN
  Administered 2014-03-02: 3000 mL

## 2014-03-04 ENCOUNTER — Encounter (HOSPITAL_COMMUNITY): Payer: Self-pay | Admitting: General Surgery

## 2015-01-09 ENCOUNTER — Encounter: Payer: Self-pay | Admitting: Family Medicine

## 2015-01-09 ENCOUNTER — Ambulatory Visit (INDEPENDENT_AMBULATORY_CARE_PROVIDER_SITE_OTHER): Payer: BLUE CROSS/BLUE SHIELD | Admitting: Family Medicine

## 2015-01-09 VITALS — BP 134/96 | Ht 64.0 in | Wt 205.0 lb

## 2015-01-09 DIAGNOSIS — F329 Major depressive disorder, single episode, unspecified: Secondary | ICD-10-CM

## 2015-01-09 DIAGNOSIS — F32A Depression, unspecified: Secondary | ICD-10-CM

## 2015-01-09 DIAGNOSIS — R0600 Dyspnea, unspecified: Secondary | ICD-10-CM

## 2015-01-09 MED ORDER — CITALOPRAM HYDROBROMIDE 20 MG PO TABS: 20.0000 mg | ORAL_TABLET | Freq: Every day | ORAL | Status: DC

## 2015-01-09 NOTE — Patient Instructions (Signed)
Celexa ( Citalopram ) the first week take 1/2 tablet daily then 1 per day  Recheck here in 3 weeks   Panic Attacks Panic attacks are sudden, short-livedsurges of severe anxiety, fear, or discomfort. They may occur for no reason when you are relaxed, when you are anxious, or when you are sleeping. Panic attacks may occur for a number of reasons:   Healthy people occasionally have panic attacks in extreme, life-threatening situations, such as war or natural disasters. Normal anxiety is a protective mechanism of the body that helps [place] react to danger (fight or flight response).  Panic attacks are often seen with anxiety disorders, such as panic disorder, social anxiety disorder, generalized anxiety disorder, and phobias. Anxiety disorders cause excessive or uncontrollable anxiety. They may interfere with your relationships or other life activities.  Panic attacks are sometimes seen with other mental illnesses, such as depression and posttraumatic stress disorder.  Certain medical conditions, prescription medicines, and drugs of abuse can cause panic attacks. SYMPTOMS  Panic attacks start suddenly, peak within 20 minutes, and are accompanied by four or more of the following symptoms:  Pounding heart or fast heart rate (palpitations).  Sweating.  Trembling or shaking.  Shortness of breath or feeling smothered.  Feeling choked.  Chest pain or discomfort.  Nausea or strange feeling in your stomach.  Dizziness, light-headedness, or feeling like you will faint.  Chills or hot flushes.  Numbness or tingling in your lips or hands and feet.  Feeling that things are not real or feeling that you are not yourself.  Fear of losing control or going crazy.  Fear of dying. Some of these symptoms can mimic serious medical conditions. For example, you may think you are having a heart attack. Although panic attacks can be very scary, they are not life threatening. DIAGNOSIS  Panic attacks are  diagnosed through an assessment by your health care provider. Your health care provider will ask questions about your symptoms, such as where and when they occurred. Your health care provider will also ask about your medical history and use of alcohol and drugs, including prescription medicines. Your health care provider may order blood tests or other studies to rule out a serious medical condition. Your health care provider may refer you to a mental health professional for further evaluation. TREATMENT   Most healthy people who have one or two panic attacks in an extreme, life-threatening situation will not require treatment.  The treatment for panic attacks associated with anxiety disorders or other mental illness typically involves counseling with a mental health professional, medicine, or a combination of both. Your health care provider will help determine what treatment is best for you.  Panic attacks due to physical illness usually go away with treatment of the illness. If prescription medicine is causing panic attacks, talk with your health care provider about stopping the medicine, decreasing the dose, or substituting another medicine.  Panic attacks due to alcohol or drug abuse go away with abstinence. Some adults need professional help in order to stop drinking or using drugs. HOME CARE INSTRUCTIONS   Take all medicines as directed by your health care provider.   Schedule and attend follow-up visits as directed by your health care provider. It is important to keep all your appointments. SEEK MEDICAL CARE IF:  You are not able to take your medicines as prescribed.  Your symptoms do not improve or get worse. SEEK IMMEDIATE MEDICAL CARE IF:   You experience panic attack symptoms that are different  than your usual symptoms.  You have serious thoughts about hurting yourself or others.  You are taking medicine for panic attacks and have a serious side effect. MAKE SURE  YOU:  Understand these instructions.  Will watch your condition.  Will get help right away if you are not doing well or get worse. Document Released: 11/04/2005 Document Revised: 11/09/2013 Document Reviewed: 06/18/2013 [facility] Patient Information 2015 [place], [place]. This information is not intended to replace advice given to you by your health care provider. Make sure you discuss any questions you have with your health care provider. Depression Depression refers to feeling sad, low, down in the dumps, blue, gloomy, or empty. In general, there are two kinds of depression:  Normal sadness or normal grief. This kind of depression is one that we all feel from time to time after upsetting life experiences, such as the loss of a job or the ending of a relationship. This kind of depression is considered normal, is short lived, and resolves within a few days to 2 weeks. Depression experienced after the loss of a loved one (bereavement) often lasts longer than 2 weeks but normally gets better with time.  Clinical depression. This kind of depression lasts longer than normal sadness or normal grief or interferes with your ability to function at home, at work, and in school. It also interferes with your personal relationships. It affects almost every aspect of your life. Clinical depression is an illness. Symptoms of depression can also be caused by conditions other than those mentioned above, such as:  Physical illness. Some physical illnesses, including underactive thyroid gland (hypothyroidism), severe anemia, specific types of cancer, diabetes, uncontrolled seizures, heart and lung problems, strokes, and chronic pain are commonly associated with symptoms of depression.  Side effects of some prescription medicine. In some people, certain types of medicine can cause symptoms of depression.  Substance abuse. Abuse of alcohol and illicit drugs can cause symptoms of depression. SYMPTOMS Symptoms of  normal sadness and normal grief include the following:  Feeling sad or crying for short periods of time.  Not caring about anything (apathy).  Difficulty sleeping or sleeping too much.  No longer able to enjoy the things you used to enjoy.  Desire to be by oneself all the time (social isolation).  Lack of energy or motivation.  Difficulty concentrating or remembering.  Change in appetite or weight.  Restlessness or agitation. Symptoms of clinical depression include the same symptoms of normal sadness or normal grief and also the following symptoms:  Feeling sad or crying all the time.  Feelings of guilt or worthlessness.  Feelings of hopelessness or helplessness.  Thoughts of suicide or the desire to harm yourself (suicidal ideation).  Loss of touch with reality (psychotic symptoms). Seeing or hearing things that are not real (hallucinations) or having false beliefs about your life or the people around you (delusions and paranoia). DIAGNOSIS  The diagnosis of clinical depression is usually based on how bad the symptoms are and how long they have lasted. Your health care provider will also ask you questions about your medical history and substance use to find out if physical illness, use of prescription medicine, or substance abuse is causing your depression. Your health care provider may also order blood tests. TREATMENT  Often, normal sadness and normal grief do not require treatment. However, sometimes antidepressant medicine is given for bereavement to ease the depressive symptoms until they resolve. The treatment for clinical depression depends on how bad the symptoms are  but often includes antidepressant medicine, counseling with a mental health professional, or both. Your health care provider will help to determine what treatment is best for you. Depression caused by physical illness usually goes away with appropriate medical treatment of the illness. If prescription medicine  is causing depression, talk with your health care provider about stopping the medicine, decreasing the dose, or changing to another medicine. Depression caused by the abuse of alcohol or illicit drugs goes away when you stop using these substances. Some adults need professional help in order to stop drinking or using drugs. SEEK IMMEDIATE MEDICAL CARE IF:  You have thoughts about hurting yourself or others.  You lose touch with reality (have psychotic symptoms).  You are taking medicine for depression and have a serious side effect. FOR MORE INFORMATION  National Alliance on Mental Illness: www.nami.[employer]: https://carter.com/ Document Released: 11/01/2000 Document Revised: 03/21/2014 Document Reviewed: 02/03/2012 [facility] Patient Information 2015 [place], [place]. This information is not intended to replace advice given to you by your health care provider. Make sure you discuss any questions you have with your health care provider.

## 2015-01-09 NOTE — Progress Notes (Signed)
   Subjective:    Patient ID: Tiffany Townsend, female    DOB: August 06, 1976, 39 y.o.   MRN: [id number]  Anxiety Onset was 1 to 6 months ago. Symptoms include decreased concentration, depressed mood, excessive worry, nervous/anxious behavior and shortness of breath. Patient reports no chest pain or confusion. The quality of sleep is fair.   Past treatments include nothing.   Chest tightness happens intermittent,only with stress,sometimes sweaty,lightheaded  Felt stressed latlewy Some life events are stressful Feels tired a lot Feels depressed not suicidal      Review of Systems  Constitutional: Negative for activity change, appetite change and fatigue.  HENT: Negative for congestion.   Respiratory: Positive for shortness of breath. Negative for cough.   Cardiovascular: Negative for chest pain.  Gastrointestinal: Negative for abdominal pain.  Endocrine: Negative for polydipsia and polyphagia.  Neurological: Negative for weakness.  Psychiatric/Behavioral: Positive for decreased concentration. Negative for confusion. The patient is nervous/anxious.        Objective:   Physical Exam  Constitutional: She appears well-nourished. No distress.  Cardiovascular: Normal rate, regular rhythm and normal heart sounds.   No murmur heard. Pulmonary/Chest: Effort normal and breath sounds normal. No respiratory distress.  Musculoskeletal: She exhibits no edema.  Lymphadenopathy:    She has no cervical adenopathy.  Neurological: She is alert. She exhibits normal muscle tone.  Psychiatric: Her behavior is normal.  Vitals reviewed.         Assessment & Plan:  Chest fullness with anxiety I would recommend EKG. EKG looks normal. I feel that her symptoms are proper testing recommended to look at cholesterol glucose. Dyspnea-I feel this is coming from the stress issues. I don't feel there is any underlying pulmonology. I don't recommend any pulmonary function test chest x-rays blood gases or  d-dimer is currently. I feel the patient is suffering with depression with intermittent anxiety and panic attacks-if patient is interested in counseling we can help set this up. She relates she will do it as long as she can afford it. In addition to this I also recommend Celexa 20 mg one half tablet daily for the course of the next week then 1 daily thereafter 25 minutes spent with patient Follow-up 2-3 weeks.

## 2015-01-11 ENCOUNTER — Other Ambulatory Visit: Payer: Self-pay

## 2015-01-11 DIAGNOSIS — Z13220 Encounter for screening for lipoid disorders: Secondary | ICD-10-CM

## 2015-01-11 DIAGNOSIS — Z131 Encounter for screening for diabetes mellitus: Secondary | ICD-10-CM

## 2015-01-11 LAB — GLUCOSE, RANDOM: GLUCOSE: 88 mg/dL (ref 70–99)

## 2015-01-11 LAB — LIPID PANEL
Cholesterol: 152 mg/dL (ref 0–200)
HDL: 31 mg/dL — AB (ref >=46)
LDL CALC: 89 mg/dL (ref 0–99)
Total CHOL/HDL Ratio: 4.9 Ratio
Triglycerides: 158 mg/dL — ABNORMAL HIGH (ref <150)
VLDL: 32 mg/dL (ref 0–40)

## 2015-01-12 ENCOUNTER — Encounter: Payer: Self-pay | Admitting: Family Medicine

## 2015-01-17 ENCOUNTER — Encounter: Payer: Self-pay | Admitting: Family Medicine

## 2015-01-27 ENCOUNTER — Ambulatory Visit (INDEPENDENT_AMBULATORY_CARE_PROVIDER_SITE_OTHER): Payer: BLUE CROSS/BLUE SHIELD | Admitting: Family Medicine

## 2015-01-27 ENCOUNTER — Encounter: Payer: Self-pay | Admitting: Family Medicine

## 2015-01-27 VITALS — BP 124/82 | Ht 64.0 in | Wt 206.0 lb

## 2015-01-27 DIAGNOSIS — F410 Panic disorder [episodic paroxysmal anxiety] without agoraphobia: Secondary | ICD-10-CM | POA: Diagnosis not present

## 2015-01-27 DIAGNOSIS — R197 Diarrhea, unspecified: Secondary | ICD-10-CM

## 2015-01-27 MED ORDER — ALPRAZOLAM 0.5 MG PO TABS: ORAL_TABLET | ORAL | Status: DC

## 2015-01-27 MED ORDER — CHOLESTYRAMINE 4 GM/DOSE PO POWD: 2.0000 g | Freq: Two times a day (BID) | ORAL | Status: DC

## 2015-01-27 NOTE — Progress Notes (Signed)
   Subjective:    Patient ID: Tiffany Townsend, female    DOB: 1976/06/28, 39 y.o.   MRN: [id number]  Anxiety Presents for follow-up visit. The problem has been gradually improving. Symptoms include chest pain and panic. Symptoms occur occasionally. Nothing aggravates the symptoms. The quality of sleep is good. Nighttime awakenings: none.   Treatments tried: Celexa. The treatment provided mild relief. Compliance with prior treatments has been good.   Patient also having loose stools that occur after eating. Mainly this is diarrhea she had her gallbladder taken ounce had this problem ever since then whenever she eats it goes essentially right through her causing explosive diarrhea denies mucus or blood with it. She was not having this problem before her gallbladder was taken out   Review of Systems  Cardiovascular: Positive for chest pain.       Objective:   Physical Exam  Lungs clear hearts regular pulse normal extremities no edema skin warm dry      Assessment & Plan:  Chest discomfort-anxiety related. Continue Celexa. Patient does have panic attacks for which she may use Xanax. She is to follow-up if progressive troubles or problems. Otherwise she is to do the counseling. I recommend this patient follow-up in approximately 3 months sooner problems.  Diarrhea-cholecystectomy related due to bile. I recommend Questran half capful twice daily hopefully this signs will get it under control if ongoing trouble let us now

## 2015-02-14 ENCOUNTER — Encounter: Payer: Self-pay | Admitting: Family Medicine

## 2015-03-08 ENCOUNTER — Telehealth (HOSPITAL_COMMUNITY): Payer: Self-pay

## 2015-04-21 ENCOUNTER — Telehealth (HOSPITAL_COMMUNITY): Payer: Self-pay | Admitting: *Deleted

## 2015-05-01 ENCOUNTER — Ambulatory Visit: Payer: BLUE CROSS/BLUE SHIELD | Admitting: Family Medicine

## 2015-05-09 ENCOUNTER — Ambulatory Visit: Payer: BLUE CROSS/BLUE SHIELD | Admitting: Family Medicine

## 2015-05-31 ENCOUNTER — Encounter: Payer: Self-pay | Admitting: Family Medicine

## 2015-05-31 ENCOUNTER — Ambulatory Visit (INDEPENDENT_AMBULATORY_CARE_PROVIDER_SITE_OTHER): Payer: BLUE CROSS/BLUE SHIELD | Admitting: Family Medicine

## 2015-05-31 VITALS — BP 124/84 | Ht 64.0 in | Wt 212.4 lb

## 2015-05-31 DIAGNOSIS — F410 Panic disorder [episodic paroxysmal anxiety] without agoraphobia: Secondary | ICD-10-CM

## 2015-05-31 MED ORDER — CITALOPRAM HYDROBROMIDE 40 MG PO TABS: 40.0000 mg | ORAL_TABLET | Freq: Every day | ORAL | Status: DC

## 2015-05-31 NOTE — Progress Notes (Addendum)
   Subjective:    Patient ID: Tiffany Townsend, female    DOB: 21-Nov-1975, 39 y.o.   MRN: [id number]  Anxiety Presents for follow-up visit. Symptoms occur occasionally. The severity of symptoms is mild. The quality of sleep is fair.   Compliance with prior treatments has been good.   Patient in today for a 3 month follow up on anxiety/ depression.Patient states no other concerns this visit.  Having some issues with feeling plateaued like she has not improved many otherwise doing okay  Review of Systems She denies being suicidal    Objective:   Physical Exam  Deferred patient seen be in no distress     Assessment & Plan:  Her panic attacks are under better control not have any use Xanax much  I do believe she is suffering with some mild depression on recommend increasing medication 40 mg she'll follow-up in 6 weeks let us know if there is any problems with the medicine follow-up sooner if any problems  The patient was told should she start becoming suicidal or anything else she needs follow-up immediately

## 2015-07-12 ENCOUNTER — Ambulatory Visit: Payer: BLUE CROSS/BLUE SHIELD | Admitting: Family Medicine

## 2015-08-20 ENCOUNTER — Telehealth: Payer: Self-pay | Admitting: Family Medicine

## 2015-08-20 NOTE — Telephone Encounter (Signed)
There was a letter from her insurance company stating concerned about Celexa at 40 mg because of history of liver dysfunction. I reviewed over her chart. There is nothing on her problem list regarding liver dysfunction and liver enzymes in 2015 were normal. Patient should be following up near December

## 2015-09-28 ENCOUNTER — Ambulatory Visit (INDEPENDENT_AMBULATORY_CARE_PROVIDER_SITE_OTHER): Payer: BLUE CROSS/BLUE SHIELD | Admitting: Nurse Practitioner

## 2015-09-28 ENCOUNTER — Encounter: Payer: Self-pay | Admitting: Nurse Practitioner

## 2015-09-28 VITALS — BP 120/78 | Temp 99.0°F | Wt 216.4 lb

## 2015-09-28 DIAGNOSIS — Z23 Encounter for immunization: Secondary | ICD-10-CM | POA: Diagnosis not present

## 2015-09-28 DIAGNOSIS — R300 Dysuria: Secondary | ICD-10-CM

## 2015-09-28 LAB — POCT URINALYSIS DIPSTICK
Bilirubin, UA: NEGATIVE
Glucose, UA: NEGATIVE
KETONES UA: NEGATIVE
Leukocytes, UA: NEGATIVE
Nitrite, UA: NEGATIVE
PH UA: 5
Protein, UA: NEGATIVE
Spec Grav, UA: 1.02
Urobilinogen, UA: NEGATIVE

## 2015-09-28 LAB — POCT UA - MICROSCOPIC ONLY: Bacteria, U Microscopic: NEGATIVE

## 2015-09-28 MED ORDER — SULFAMETHOXAZOLE-TRIMETHOPRIM 800-160 MG PO TABS: 1.0000 | ORAL_TABLET | Freq: Two times a day (BID) | ORAL | Status: DC

## 2015-09-28 NOTE — Progress Notes (Signed)
Subjective:  Presents for c/o urinary urgency, frequency x 2-3 weeks. Pain at the end of voiding. Pressure with voiding small to no urine. No fever. Regular menses, normal flow lasting 3-4 days. Denies possibility of pregnancy. No vaginal discharge. Same sexual partner. No recent UTI. No pelvic, back or flank pain.   Objective:   BP 120/78 mmHg  Temp(Src) 99 F (37.2 C)  Wt 216 lb 6 oz (98.147 kg) NAD. Alert, oriented. Lungs clear. Heart RRR. No CVA tenderness. Abdomen soft, non distended, non tender.  Results for orders placed or performed in visit on 09/28/15  POCT urinalysis dipstick  Result Value Ref Range   Color, UA yellow    Clarity, UA clear    Glucose, UA neg    Bilirubin, UA neg    Ketones, UA neg    Spec Grav, UA 1.020    Blood, UA ca.50    pH, UA 5.0    Protein, UA neg    Urobilinogen, UA negative    Nitrite, UA neg    Leukocytes, UA Negative Negative  POCT UA - Microscopic Only  Result Value Ref Range   WBC, Ur, HPF, POC 0-5    RBC, urine, microscopic rare    Bacteria, U Microscopic neg    Mucus, UA     Epithelial cells, urine per micros occas    Crystals, Ur, HPF, POC     Casts, Ur, LPF, POC     Yeast, UA       Assessment: Dysuria - Plan: POCT urinalysis dipstick, Urine culture, POCT UA - Microscopic Only  Plan:  Meds ordered this encounter  Medications  . sulfamethoxazole-trimethoprim (BACTRIM DS,SEPTRA DS) 800-160 MG tablet    Sig: Take 1 tablet by mouth 2 (two) times daily.    Dispense:  14 tablet    Refill:  0    Order Specific Question:  Supervising Provider    Answer:  Mikey Kirschner [[id number]]    AZO as directed for 48 hours then discontinue. Warning signs reviewed. Urine culture pending. Will cover with antibiotics due to upcoming weekend. Call or go to ED if worse.

## 2015-09-28 NOTE — Patient Instructions (Signed)
AZO as directed for 48 hours then discontinue 

## 2015-09-30 LAB — URINE CULTURE

## 2015-10-02 MED ORDER — AMOXICILLIN-POT CLAVULANATE 875-125 MG PO TABS: ORAL_TABLET | ORAL | Status: DC

## 2015-10-02 NOTE — Addendum Note (Signed)
Addended by: Ofilia Neas R on: 10/02/2015 10:46 AM   Modules accepted: Orders

## 2015-11-15 ENCOUNTER — Ambulatory Visit (INDEPENDENT_AMBULATORY_CARE_PROVIDER_SITE_OTHER): Payer: BLUE CROSS/BLUE SHIELD | Admitting: Family Medicine

## 2015-11-15 ENCOUNTER — Encounter: Payer: Self-pay | Admitting: Family Medicine

## 2015-11-15 VITALS — BP 132/90 | Temp 98.2°F | Ht 64.0 in | Wt 222.0 lb

## 2015-11-15 DIAGNOSIS — M5431 Sciatica, right side: Secondary | ICD-10-CM

## 2015-11-15 MED ORDER — HYDROCODONE-ACETAMINOPHEN 5-325 MG PO TABS: ORAL_TABLET | ORAL | Status: DC

## 2015-11-15 MED ORDER — PREDNISONE 20 MG PO TABS: ORAL_TABLET | ORAL | Status: DC

## 2015-11-15 NOTE — Progress Notes (Signed)
   Subjective:    Patient ID: Tiffany Townsend, female    DOB: [birth year], 39 y.o.   MRN: [id number]  Back Pain This is a new problem. The current episode started in the past 7 days. The problem occurs intermittently. The problem is unchanged. The pain is present in the lumbar spine. The quality of the pain is described as aching. The pain radiates to the left thigh and right thigh. The pain is moderate. The pain is the same all the time. Stiffness is present all day. She has tried NSAIDs for the symptoms. The treatment provided no relief.   Patient has no other concerns at this time.   Fairly severe pain in back and leg.    Tried to walk and hurting bad  Low back very pain  Pain goes all the way to the foot  Feeling a bit achey last wk, and trying to lift her at times hurts a lot Review of Systems  Musculoskeletal: Positive for back pain.   No vomiting no diarrhea no abdominal pain    Objective:   Physical Exam  Alert some discomfort vitals stable neck supple lungs clear heart rare rhythm right paraspinal pain no true sciatic notch tenderness plus minus straight leg raise on right      Assessment & Plan:  Impression right lumbar pain with sciatica element. Hopefully nerve root irritation from strain and not another disc rupture only time will tell. Discussed with family. Plan prednisone taper. Local measures discussed, when necessary for pain. Follow-up with Dr. Nicki Reaper next week due to severity of pain WSL

## 2015-11-22 ENCOUNTER — Ambulatory Visit (INDEPENDENT_AMBULATORY_CARE_PROVIDER_SITE_OTHER): Payer: BLUE CROSS/BLUE SHIELD | Admitting: Family Medicine

## 2015-11-22 ENCOUNTER — Encounter: Payer: Self-pay | Admitting: Family Medicine

## 2015-11-22 VITALS — Ht 64.0 in | Wt 212.0 lb

## 2015-11-22 DIAGNOSIS — M5431 Sciatica, right side: Secondary | ICD-10-CM | POA: Diagnosis not present

## 2015-11-22 HISTORY — DX: Sciatica, right side: M54.31

## 2015-11-22 MED ORDER — OXYCODONE-ACETAMINOPHEN 5-325 MG PO TABS: 1.0000 | ORAL_TABLET | ORAL | Status: DC | PRN

## 2015-11-22 MED ORDER — GABAPENTIN 100 MG PO CAPS: 100.0000 mg | ORAL_CAPSULE | Freq: Three times a day (TID) | ORAL | Status: DC

## 2015-11-22 NOTE — Progress Notes (Signed)
   Subjective:    Patient ID: Tiffany Townsend, female    DOB: 11-Oct-1976, 40 y.o.   MRN: [id number]  HPI Patient arrives for a follow up on back pain. Patient states the pain got worse Monday- going down her leg. Severe pain that is not getting better with the hydrocodone per prednisone did not help. Having severe pain it limits her worse with sitting in with walking and stooping and bending. A little bit better when she lays down approximately legs. Denies any swelling of the legs. Has had back surgery [facility] approximately 5 or so years ago.  Review of Systems See above. Relates pain discomfort and was acting side of her leg to her knee along with limited range of motion and limited ability to walk    Objective:   Physical Exam On physical exam has low back tenderness worse on the right leg positive straight leg on the right she is able to walk on her toes and on her heels but has significant pain and discomfort       Assessment & Plan:  I do not feel the patient has a surgical emergency Probable herniated disc Gabapentin to try to help alleviate the discomfort Oxycodone for severe pain caution drowsiness Avoid bending and squatting avoid heavy lifting. Gradual resumption of activity Follow-up 3-4 weeks If ongoing troubles or worse the next recommendation would be MRI and possible referral to neurosurgery. Patient was warned that if this gets worse follow-up immediately

## 2015-12-05 ENCOUNTER — Telehealth: Payer: Self-pay | Admitting: Family Medicine

## 2015-12-05 NOTE — Telephone Encounter (Signed)
Pt called stating that the pain is no longer in her legs that it is now all in her back. Pt believes that the medicine she was prescribed is given her headaches. Pt states that she is still in pain. Please advise.

## 2015-12-05 NOTE — Telephone Encounter (Signed)
Stop gabapentin, I believe it would be in her best interest to be reevaluated this week and then we can discuss possibility of if she may need MRI.

## 2015-12-06 NOTE — Telephone Encounter (Signed)
Discussed with patient. Patient advised to stop Neurontin and patient scheduled a follow up office visit for a recheck this week with Dr Nicki Reaper.

## 2015-12-06 NOTE — Telephone Encounter (Signed)
[facility] 12/06/15

## 2015-12-08 ENCOUNTER — Ambulatory Visit (INDEPENDENT_AMBULATORY_CARE_PROVIDER_SITE_OTHER): Payer: BLUE CROSS/BLUE SHIELD | Admitting: Family Medicine

## 2015-12-08 ENCOUNTER — Encounter: Payer: Self-pay | Admitting: Family Medicine

## 2015-12-08 VITALS — BP 114/80 | Ht 64.0 in | Wt 224.1 lb

## 2015-12-08 DIAGNOSIS — M5431 Sciatica, right side: Secondary | ICD-10-CM

## 2015-12-08 DIAGNOSIS — M5441 Lumbago with sciatica, right side: Secondary | ICD-10-CM

## 2015-12-08 MED ORDER — OXYCODONE-ACETAMINOPHEN 5-325 MG PO TABS: 1.0000 | ORAL_TABLET | ORAL | Status: DC | PRN

## 2015-12-08 MED ORDER — ALPRAZOLAM 0.5 MG PO TABS: ORAL_TABLET | ORAL | Status: DC

## 2015-12-08 NOTE — Progress Notes (Signed)
   Subjective:    Patient ID: Tiffany Townsend, female    DOB: [birth year], 40 y.o.   MRN: [id number]  Back Pain This is a recurrent problem. The current episode started more than 1 month ago. The pain is present in the lumbar spine. The quality of the pain is described as aching. The pain does not radiate. The symptoms are aggravated by sitting (Walking and sitting for extended times). Treatments tried: Percocet.   Patient in today for a follow up for low back pain. Patient states pain no longer radiates down leg, as bad as it did. Tried neurontin and had to discontinue use due to migraine headaches. States no other concerns this visit. Patient does feel she is improving some but she is still worried that she may end up needing another back surgery  Review of Systems  Musculoskeletal: Positive for back pain.   denies nausea vomiting diarrhea fever chills sweats     Objective:   Physical Exam Low back pain and discomfort along with positive straight leg raise on the right side she is able to walk on her toes and walk on her heels she is actually doing better than what she was a couple weeks ago lungs clear heart regular abdomen soft       Assessment & Plan:  Significant lumbar pain with some radiation into the right leg along the side it goes down to her knee I would recommend conservative measures over the next 2-3 weeks if progressive troubles or worse may need MRI patient is making some improvement. Patient understands that if she worsens she is notify us right away otherwise follow-up in a couple weeks

## 2015-12-18 ENCOUNTER — Telehealth: Payer: Self-pay | Admitting: Family Medicine

## 2015-12-18 DIAGNOSIS — M5430 Sciatica, unspecified side: Secondary | ICD-10-CM

## 2015-12-18 NOTE — Telephone Encounter (Signed)
Pt has appt 2/1 wants to know if she needs to keep that one since  She has now been scheduled for the 14th to have a MRI?

## 2015-12-18 NOTE — Telephone Encounter (Signed)
MRI in epic (needs to be scheduled) Patient can go at any time.  Call patient at [phone]

## 2015-12-18 NOTE — Telephone Encounter (Signed)
May cancel appointment for February 1 would be good idea to schedule office visit 2-3 days after MRI

## 2015-12-18 NOTE — Telephone Encounter (Signed)
Husband(Travis) called stating patient lower back pain is worst and wanting to go ahead with the MRI as soon as possible. She has appointment on 2/1 but he thinks she cant wait that long due to the pain she is in.

## 2015-12-18 NOTE — Telephone Encounter (Signed)
Lumbar MRI with contrast due to herniated disc and sciatica and history of previous surgery

## 2015-12-18 NOTE — Telephone Encounter (Signed)
Notified patient test scheduled for 01/02/16 at 7:45 am. Patient verbalized understanding.

## 2015-12-19 NOTE — Telephone Encounter (Signed)
appt moved to the 17th

## 2015-12-20 ENCOUNTER — Ambulatory Visit: Payer: BLUE CROSS/BLUE SHIELD | Admitting: Family Medicine

## 2015-12-25 ENCOUNTER — Telehealth: Payer: Self-pay | Admitting: Family Medicine

## 2015-12-25 MED ORDER — OXYCODONE-ACETAMINOPHEN 5-325 MG PO TABS: 1.0000 | ORAL_TABLET | ORAL | Status: DC | PRN

## 2015-12-25 NOTE — Telephone Encounter (Signed)
Pt is requesting a refill on her oxyCODONE-acetaminophen (PERCOCET/ROXICET) 5-325 MG tablet.

## 2015-12-25 NOTE — Telephone Encounter (Signed)
Called patient and informed her per Dr.Scott Luking-prescription is ready for pick up. Patient verbalized understanding.

## 2015-12-25 NOTE — Telephone Encounter (Signed)
May refill oxycodone

## 2015-12-26 ENCOUNTER — Telehealth: Payer: Self-pay | Admitting: Family Medicine

## 2015-12-26 NOTE — Telephone Encounter (Signed)
Request for MRI for pt was DENIED, please see denial letter in red folder, please advise   (it's scheduled for 01/02/16 & has not been cancelled)

## 2015-12-27 NOTE — Telephone Encounter (Signed)
Called patient and informed her per Dr.Scott Luking-the patient because of her insurance company they stated that she has not met the criteria at this point to have a MRI. She has to be under a doctor's care for 6 weeks from the time it presents to the doctor's office along with documentation of a follow-up after that 6 weeks to indicate that the treatment has not been successful before they will approve a MRI therefore we will have the patient follow-up toward the end of next week and we can document at that point in time and reschedule MRI, hopefully at that point it will get approved I would recommend follow-up Thursday or Friday. Patient verbalized understanding and stated that she has follow up appointment for Friday. Appointment is for Friday 17th at 1:10 transferred patient to front desk for an earlier appointment on Friday.

## 2015-12-27 NOTE — Telephone Encounter (Signed)
Please talk with the patient because of her insurance company they stated that she has not met the criteria at this point to have a MRI. She has to be under a doctor's care for 6 weeks from the time it presents to the doctor's office along with documentation of a follow-up after that 6 weeks to indicate that the treatment has not been successful before they will approve a MRI therefore we will have the patient follow-up toward the end of next week and we can document at that point in time and reschedule MRI, hopefully at that point it will get approved I would recommend follow-up Thursday or Friday(not Friday afternoon) of next week

## 2016-01-02 ENCOUNTER — Ambulatory Visit (HOSPITAL_COMMUNITY): Admission: RE | Admit: 2016-01-02 | Payer: BLUE CROSS/BLUE SHIELD | Source: Ambulatory Visit

## 2016-01-05 ENCOUNTER — Encounter: Payer: Self-pay | Admitting: Family Medicine

## 2016-01-05 ENCOUNTER — Ambulatory Visit: Payer: BLUE CROSS/BLUE SHIELD | Admitting: Family Medicine

## 2016-01-05 ENCOUNTER — Ambulatory Visit (INDEPENDENT_AMBULATORY_CARE_PROVIDER_SITE_OTHER): Payer: BLUE CROSS/BLUE SHIELD | Admitting: Family Medicine

## 2016-01-05 VITALS — BP 124/84 | Ht 64.0 in | Wt 220.1 lb

## 2016-01-05 DIAGNOSIS — M5430 Sciatica, unspecified side: Secondary | ICD-10-CM | POA: Diagnosis not present

## 2016-01-05 MED ORDER — GABAPENTIN 100 MG PO CAPS: 100.0000 mg | ORAL_CAPSULE | Freq: Three times a day (TID) | ORAL | Status: DC

## 2016-01-05 MED ORDER — CITALOPRAM HYDROBROMIDE 20 MG PO TABS: 20.0000 mg | ORAL_TABLET | Freq: Every day | ORAL | Status: DC

## 2016-01-05 NOTE — Progress Notes (Signed)
   Subjective:    Patient ID: Tiffany Townsend, female    DOB: [birth year], 40 y.o.   MRN: [id number]  HPI  Patient in today for a recheck of right side sciatic pain.   patient feels she is getting better she is more hopeful that she will have to have surgery she denies any severe numbness or weakness into the leg. She is able to do some housework. She also states a fair amount of stress from being stuck in the house with anxiousness and nervousness. States no other concerns this visit.  Review of Systems  denies severe weakness denies fever chills vomiting diarrhea rash joint pain    Objective:   Physical Exam    lungs clear heart regular low back subjective tenderness mild sciatica into the leg on the right side. Not severe patient able to move around better     Assessment & Plan:   back is actually doing better still has sciatica we will try gabapentin again last time it caused a headache and she stopped it hopefully this time she will tolerated if she has side effects she will stop it start off with 1 in the evening for 5 days then 1 twice a day for a week then 13 times a day if side effects stop   Oxycodone for severe pain only patient states she doesn't need refill currently   If progressive symptoms over the course of the next couple months call us back may need to do MRI  Anxiety issues stress issues Xanax rarely May use Celexa lower dose 20 mg start off half tablet for the first week then 1 tablet daily if depression symptoms are getting worst follow-up sooner otherwise follow-up 3 months

## 2016-01-16 ENCOUNTER — Telehealth: Payer: Self-pay | Admitting: Family Medicine

## 2016-01-16 MED ORDER — OXYCODONE-ACETAMINOPHEN 5-325 MG PO TABS: 1.0000 | ORAL_TABLET | ORAL | Status: DC | PRN

## 2016-01-16 NOTE — Telephone Encounter (Signed)
May have refill

## 2016-01-16 NOTE — Telephone Encounter (Signed)
Rx up front for patient pick up. Patient notified. 

## 2016-01-16 NOTE — Telephone Encounter (Signed)
Requesting Rx for oxyCODONE-acetaminophen (PERCOCET/ROXICET) 5-325 MG tablet

## 2016-01-16 NOTE — Telephone Encounter (Signed)
Last filled 12/25/15 for # 45

## 2016-01-25 ENCOUNTER — Telehealth: Payer: Self-pay | Admitting: Family Medicine

## 2016-01-25 NOTE — Telephone Encounter (Signed)
Patient is wanting [place] to schedule MRI for her back pain as soon as possible and she wants refill on percocet 5/325 mg also.

## 2016-01-25 NOTE — Telephone Encounter (Signed)
Discussed with patient. Patient scheduled office visit tomorrow for re evaluation for MRI due to back pain returning

## 2016-01-26 ENCOUNTER — Ambulatory Visit (INDEPENDENT_AMBULATORY_CARE_PROVIDER_SITE_OTHER): Payer: BLUE CROSS/BLUE SHIELD | Admitting: Family Medicine

## 2016-01-26 ENCOUNTER — Encounter: Payer: Self-pay | Admitting: Family Medicine

## 2016-01-26 VITALS — BP 100/76 | Temp 97.6°F | Ht 64.0 in | Wt 224.2 lb

## 2016-01-26 DIAGNOSIS — M5431 Sciatica, right side: Secondary | ICD-10-CM | POA: Diagnosis not present

## 2016-01-26 MED ORDER — OXYCODONE-ACETAMINOPHEN 7.5-325 MG PO TABS: 1.0000 | ORAL_TABLET | ORAL | Status: DC | PRN

## 2016-01-26 NOTE — Progress Notes (Signed)
   Subjective:    Patient ID: Tiffany Townsend, female    DOB: 08-Sep-1976, 40 y.o.   MRN: [id number]  Back Pain This is a new problem. The current episode started more than 1 month ago. The problem occurs intermittently. The problem is unchanged. The pain is present in the lumbar spine. The quality of the pain is described as aching. The pain radiates to the right thigh and right foot. The pain is moderate. Stiffness is present all day. Treatments tried: percocoet  The treatment provided mild relief.   severe low back pain that's been present ever since and December have tried anti-inflammatories tried gabapentin stretching excises nothing is seeming to help she has progressive pain in the back radiates down the leg wakes her up at night keeps her from doing things. Describes weakness in the leg because of the pain difficult time going up and down steps cannot sit for any length of time very difficult to lay down. Very restless interferes with sleep she's had previous surgery. Patient has head congestion also. Slight runny nose cough congestion no high fever chills present only a few days   Review of Systems  Musculoskeletal: Positive for back pain.   severe sciatica severe pain down the right leg severe pain in the lower back.     Objective:   Physical Exam  Patient with low back pain positive sciatica on the right side with straight leg raise decreased strength on the right side because of severe pain. Patient unable to walk on her heels or toes because of pain. Patient unable to bend or rotate because of the pain. Patient does state that he's been having progressive troubles over the past 8-12 weeks I recommend for the patient to have MRI see below  Her lungs are clear no crackles HEENT benign    Assessment & Plan:  Severe low back pain been present for over 2-1/2-3 months now. Have tried gabapentin have tried anti-inflammatories in addition to this have tried stretching exercises. Unable to  see any improvement patients had significant worsening of her issues. It is felt in the patient's best interest to go ahead and get MRI this patient's had previous surgery may need referral back to neurosurgery.  Viral illness no need for antibiotics if persistent trouble let us now

## 2016-02-06 ENCOUNTER — Ambulatory Visit (HOSPITAL_COMMUNITY): Admission: RE | Admit: 2016-02-06 | Payer: BLUE CROSS/BLUE SHIELD | Source: Ambulatory Visit

## 2016-02-14 ENCOUNTER — Ambulatory Visit (HOSPITAL_COMMUNITY)
Admission: RE | Admit: 2016-02-14 | Discharge: 2016-02-14 | Disposition: A | Discharge status: Home / Self Care | Payer: BLUE CROSS/BLUE SHIELD | Source: Ambulatory Visit | Attending: Family Medicine | Admitting: Family Medicine

## 2016-02-14 DIAGNOSIS — M5126 Other intervertebral disc displacement, lumbar region: Secondary | ICD-10-CM | POA: Diagnosis not present

## 2016-02-14 DIAGNOSIS — M2578 Osteophyte, vertebrae: Secondary | ICD-10-CM | POA: Diagnosis not present

## 2016-02-14 DIAGNOSIS — Z9889 Other specified postprocedural states: Secondary | ICD-10-CM | POA: Insufficient documentation

## 2016-02-14 DIAGNOSIS — G9619 Other disorders of meninges, not elsewhere classified: Secondary | ICD-10-CM | POA: Diagnosis not present

## 2016-02-14 DIAGNOSIS — M1288 Other specific arthropathies, not elsewhere classified, other specified site: Secondary | ICD-10-CM | POA: Diagnosis not present

## 2016-02-14 DIAGNOSIS — M5431 Sciatica, right side: Secondary | ICD-10-CM | POA: Diagnosis present

## 2016-02-14 DIAGNOSIS — M545 Low back pain: Secondary | ICD-10-CM | POA: Diagnosis present

## 2016-02-14 DIAGNOSIS — M5136 Other intervertebral disc degeneration, lumbar region: Secondary | ICD-10-CM | POA: Diagnosis not present

## 2016-02-14 MED ORDER — SODIUM CHLORIDE 0.9% FLUSH
INTRAVENOUS | Status: AC
  Filled 2016-02-14: qty 50

## 2016-02-14 MED ORDER — GADOBENATE DIMEGLUMINE 529 MG/ML IV SOLN
20.0000 mL | Freq: Once | INTRAVENOUS | Status: AC | PRN
  Administered 2016-02-14: 20 mL via INTRAVENOUS

## 2016-02-15 ENCOUNTER — Other Ambulatory Visit: Payer: Self-pay | Admitting: *Deleted

## 2016-02-15 DIAGNOSIS — M545 Low back pain: Secondary | ICD-10-CM

## 2016-02-15 DIAGNOSIS — M549 Dorsalgia, unspecified: Secondary | ICD-10-CM

## 2016-02-15 DIAGNOSIS — M779 Enthesopathy, unspecified: Secondary | ICD-10-CM

## 2016-02-19 ENCOUNTER — Telehealth: Payer: Self-pay | Admitting: Family Medicine

## 2016-02-19 ENCOUNTER — Encounter: Payer: Self-pay | Admitting: Family Medicine

## 2016-02-19 MED ORDER — OXYCODONE-ACETAMINOPHEN 7.5-325 MG PO TABS: 1.0000 | ORAL_TABLET | ORAL | Status: DC | PRN

## 2016-02-19 NOTE — Telephone Encounter (Signed)
Under result notes it is stated that the patient will call us with the name of a neurosurgeon that she once to see

## 2016-02-19 NOTE — Telephone Encounter (Signed)
Spoke with patient and informed her per Dr.Scott Luking that prescription for Percocet is ready for pick up. Also looked up patient's referral and no appointment had been made for specialist.

## 2016-02-19 NOTE — Telephone Encounter (Signed)
May refill prescription. Please confirm patient is being set up with specialist

## 2016-02-19 NOTE — Telephone Encounter (Signed)
Pt is needing a refill on her oxyCODONE-acetaminophen (PERCOCET) 7.5-325 MG tablet

## 2016-03-08 ENCOUNTER — Telehealth: Payer: Self-pay | Admitting: Family Medicine

## 2016-03-08 ENCOUNTER — Other Ambulatory Visit: Payer: Self-pay | Admitting: *Deleted

## 2016-03-08 MED ORDER — OXYCODONE-ACETAMINOPHEN 7.5-325 MG PO TABS: 1.0000 | ORAL_TABLET | ORAL | Status: DC | PRN

## 2016-03-08 NOTE — Telephone Encounter (Signed)
Pt does not need until next week. She states she will pick up on Monday. Script ready up front.

## 2016-03-08 NOTE — Telephone Encounter (Signed)
Patient is requesting refill on oxycodone 7.5/325

## 2016-03-08 NOTE — Telephone Encounter (Signed)
Ok refill times one, [facility] if pt wants me to

## 2016-03-08 NOTE — Telephone Encounter (Signed)
Last filled 02/19/16 #60

## 2016-03-18 ENCOUNTER — Ambulatory Visit (HOSPITAL_COMMUNITY): Payer: BLUE CROSS/BLUE SHIELD | Attending: Neurosurgery | Admitting: Physical Therapy

## 2016-03-18 ENCOUNTER — Encounter (INDEPENDENT_AMBULATORY_CARE_PROVIDER_SITE_OTHER): Payer: Self-pay

## 2016-03-18 DIAGNOSIS — R293 Abnormal posture: Secondary | ICD-10-CM | POA: Diagnosis present

## 2016-03-18 DIAGNOSIS — M5441 Lumbago with sciatica, right side: Secondary | ICD-10-CM | POA: Diagnosis present

## 2016-03-18 DIAGNOSIS — R262 Difficulty in walking, not elsewhere classified: Secondary | ICD-10-CM

## 2016-03-18 DIAGNOSIS — M6281 Muscle weakness (generalized): Secondary | ICD-10-CM | POA: Diagnosis present

## 2016-03-18 NOTE — Patient Instructions (Signed)
   BRIDGING  While lying on your back, tighten your lower abdominals, squeeze your buttocks and then raise your buttocks off the floor/bed as creating a "Bridge" with your body. Hold and then lower yourself and repeat.  Repeat 10 times, twice a day; make sure you are squeezing your core tight and pushing down onto the table with your arms throughout the exercise.    HIP ABDUCTION - SIDELYING  While lying on your side, slowly raise up your top leg to the side. Keep your knee straight and maintain your toes pointed forward the entire time. Keep your leg in-line with your body.  The bottom leg can be bent to stabilize your body.  Repeat 10 times each side, twice a day.    Transverse Abdominus Activation  Lying on your back, pull your bellybutton into your spine. It should feel like you are sucking your belly button into your spine.  Hold for 3 seconds, and repeat 20 times, 5 times per day.

## 2016-03-18 NOTE — Therapy (Signed)
Tiffany Townsend, [place], [address] Phone: [phone]   Fax:  [phone]  Physical Therapy Evaluation  Patient Details  Name: Tiffany Townsend MRN: [id number] Date of Birth: Apr 05, 1976 Referring Provider: Kary Kos   Encounter Date: 03/18/2016      PT End of Session - 03/18/16 1218    Visit Number 1   Number of Visits 12   Date for PT Re-Evaluation 04/08/16   Authorization Type [employer] (30 visit limit, 0 used at tmie of eval)   Authorization Time Period 03/18/16 to 04/29/16   Authorization - Visit Number 1   Authorization - Number of Visits 19   PT Start Time 1037  patient showed up right on time/had eval check-in/paperwork    PT Stop Time 1112   PT Time Calculation (min) 35 min   Activity Tolerance Patient tolerated treatment well   Behavior During Therapy [facility] for tasks assessed/performed      Past Medical History  Diagnosis Date  . Anxiety   . Headache(784.0)     history migranes ocassionally  . Sciatica of right side 11/22/2015    Past Surgical History  Procedure Laterality Date  . Back surgery  04/2011    disc surgery  . Cysto      with dilation  . Urethral dilation N/A as child  . Cholecystectomy N/A 03/02/2014    Procedure: LAPAROSCOPIC CHOLECYSTECTOMY;  Surgeon: [name], MD;  Location: [facility];  Service: General;  Laterality: N/A;    There were no vitals filed for this visit.       Subjective Assessment - 03/18/16 1039    Subjective Patient had back surgery in 2012; she reports that all was giong well until New Years/Christmas of 2016 when she woke up one morning not being able to put her feet on the floor. Has numbness and tingling in the front of her leg and also the side, going all the way down to her toes. No numbness or problems with bowels or bladder. She has not fallen but she has had a few times where she has almost fallen; does have children that are still young enough to want to be picked  up. She has been having really sharp pains when she tries to get up, feels like she gets stuck and has to stop before she can move again- transition movements are difficult, and pain is mosly in her hip.    Pertinent History anxiety, R sided sciatica, history of back surgery    How long can you sit comfortably? 5/1- it depnds on the day; when it rains, it is worse    How long can you stand comfortably? 5/1- not sure, but shopping is difficult. Maybe 10 minutes.    How long can you walk comfortably? 5/1- hurts immediately    Patient Stated Goals get rid of pain, get better    Currently in Pain? No/denies            [facility] PT Assessment - 03/18/16 0001    Assessment   Medical Diagnosis DDD, lumbosacral    Referring Provider Kary Kos    Onset Date/Surgical Date --  holidays of 2016   Next MD Visit Has one but needs to reschedule    Precautions   Precaution Comments PCP does not want her lifting anything heavy    Balance Screen   Has the patient fallen in the past 6 months No   Has the patient had a decrease  in activity level because of a fear of falling?  No   Is the patient reluctant to leave their home because of a fear of falling?  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    Leisure none    Observation/Other Assessments   Observations relief of symptoms with manual traction; no LLD noted in supine today   R hip negative scour, impingmeent, FABER    Sensation   Light Touch Appears Intact   Posture/Postural Control   Posture Comments increased lumbar lordosis    AROM   Overall AROM Comments iritation with repeated flexion and extension   R hip appears clear except for hamstring/piriformis    Lumbar Flexion 39   Lumbar Extension 15   Lumbar - Right Side Bend approx 1 inch above midline of jiont    Lumbar - Left Side Bend  approx 1 inch above midline of jiont    Strength   Right Hip Flexion 4-/5   Right Hip Extension 2+/5  rotation of trunk    Right Hip ABduction 3/5   Left Hip Flexion 5/5   Left Hip Extension 2+/5  rotation of trunk    Left Hip ABduction 3/5   Right Knee Flexion 4-/5   Right Knee Extension 4-/5   Left Knee Flexion 5/5   Left Knee Extension 4/5   Right Ankle Dorsiflexion 4/5   Left Ankle Dorsiflexion 5/5   Flexibility   Hamstrings R moderate lim    Piriformis R moderate lim    Ambulation/Gait   Gait Comments holds spine very rigid with very little rotation of trunk/pelvis, proximal muscle weakness    6 minute walk test results    Aerobic Endurance Distance Walked 658   Endurance additional comments 3MWT, no rest, no device    High Level Balance   High Level Balance Comments TUG 8.68                           PT Education - 03/18/16 1217    Education provided Yes   Education Details plan of care, HEP, prognosis    Person(s) Educated Patient   Methods Explanation;Demonstration;Handout   Comprehension Verbalized understanding;Returned demonstration;Need further instruction          PT Short Term Goals - 03/18/16 1223    PT SHORT TERM GOAL #1   Title Patient to demonstrate lumbar ROM WFL in order to  assist in reducing pain and improving general function throughout her day    Time 3   Period Weeks   Status New   PT SHORT TERM GOAL #2   Title Patient to experience pain no more than 3/10 during functional transistions in order to improve overall mobility and QOL    Time 3   Period Weeks   Status New   PT SHORT TERM GOAL #3   Title Patient to maintain correct posture at least 75% of the time in order to assist in reducing pain and improving general function throughout her day    Time 3   Period Weeks   Status New   PT SHORT TERM GOAL #4   Title Patient to be independent in correctly and consistently performing appropriate HEP, to be updated PRN     Time 3   Period Weeks   Status New  PT Long Term Goals - 03/18/16 1224    PT LONG TERM GOAL #1   Title Patient to demonstrate strength at least 4/5 in all tested muscle groups in order to assist in reducing pain adn improving fucntional task performance    Time 6   Period Weeks   Status New   PT LONG TERM GOAL #2   Title Patient to demonstrate correct lifting techniques in order to prevent exacerbation or recurrence of back pain during her daily functional activiities    Time 6   Period Weeks   Status New   PT LONG TERM GOAL #3   Title Patient to experience pain no more than 1/10 during all functional transitions and tasks in order to improve functional task performance and improve QOL    Time 6   Period Weeks   Status New   PT LONG TERM GOAL #4   Title Patient to report she has successfully been able to get up and down from the floor to play with her children with pain no more than 1/10 in order to improve overall QOL    Time 6   Period Weeks   Status New               Plan - 03/18/16 1219    Clinical Impression Statement Patient arrives with back pain taht she reports she had surgery for in 2012, and was doing OK with, until she woke up one morning around the holidays in late 2016 and could not put her feet on teh floor; she reports she is fairly limited in mobility and gait tolerance right now due to pain. Upon examination, patient reveals signfiicant postural and gait impairments, muscle weakness, low back pain and stiffness, and reduced functional task tolerance and performance. She does report relief with manual traction of the lumbar area; at this piont, hips appear clear of contributing to her symptoms and overall presentation. At this time recommend skilled PT services in order to address functional  status and to assist in reaching optimal level of function.    Rehab Potential Good   PT Frequency 2x / week   PT Duration 6 weeks   PT  Treatment/Interventions ADLs/Self Care Home Management;Biofeedback;Cryotherapy;Moist Heat;Gait training;Stair training;Functional mobility training;Therapeutic activities;Therapeutic exercise;Balance training;Neuromuscular re-education;Patient/family education;Manual techniques;Taping   PT Next Visit Plan review HEP and goals; postural trainng, functional stretching and strengthening, lumbar and hip mobility, manual PRN    PT Home Exercise Plan given    Consulted and Agree with Plan of Care Patient      Patient will benefit from skilled therapeutic intervention in order to improve the following deficits and impairments:  Abnormal gait, Hypomobility, Obesity, Decreased activity tolerance, Decreased strength, Pain, Difficulty walking, Decreased mobility, Improper body mechanics, Decreased coordination, Postural dysfunction  Visit Diagnosis: Right-sided low back pain with right-sided sciatica - Plan: PT plan of care cert/re-cert  Muscle weakness (generalized) - Plan: PT plan of care cert/re-cert  Abnormal posture - Plan: PT plan of care cert/re-cert  Difficulty in walking, not elsewhere classified - Plan: PT plan of care cert/re-cert     Problem List Patient Active Problem List   Diagnosis Date Noted  . Sciatica of right side 11/22/2015  . Diarrhea 01/27/2015  . Panic attacks 01/27/2015    Deniece Ree PT, DPT [place] [address] [place], [place], [address] Phone: [phone]   Fax:  [phone]  Name: EMERIE AYO MRN: [id number] Date of Birth: Jul 23, 1976

## 2016-03-26 ENCOUNTER — Ambulatory Visit (HOSPITAL_COMMUNITY): Payer: BLUE CROSS/BLUE SHIELD

## 2016-03-26 DIAGNOSIS — M5441 Lumbago with sciatica, right side: Secondary | ICD-10-CM

## 2016-03-26 DIAGNOSIS — R262 Difficulty in walking, not elsewhere classified: Secondary | ICD-10-CM

## 2016-03-26 DIAGNOSIS — M6281 Muscle weakness (generalized): Secondary | ICD-10-CM

## 2016-03-26 DIAGNOSIS — R293 Abnormal posture: Secondary | ICD-10-CM

## 2016-03-26 NOTE — Patient Instructions (Signed)
Lower Trunk Rotation Stretch    Keeping back flat keeping stomach muscle tight and feet together, rotate knees to left side. Hold 10 seconds. Repeat 5 times per set. Do 1-2 sets per session.   http://orth.exer.us/123   Copyright  VHI. All rights reserved.

## 2016-03-26 NOTE — Therapy (Signed)
[place], [place], [address] Phone: [phone]   Fax:  [phone]  Physical Therapy Treatment  Patient Details  Name: Tiffany Townsend MRN: [id number] Date of Birth: 31-Mar-1976 Referring Provider: Kary Kos   Encounter Date: 03/26/2016      PT End of Session - 03/26/16 1407    Visit Number 2   Number of Visits 12   Date for PT Re-Evaluation 04/08/16   Authorization Type [employer] (30 visit limit, 0 used at tmie of eval)   Authorization Time Period 03/18/16 to 04/29/16   Authorization - Visit Number 2   Authorization - Number of Visits 30   PT Start Time 8841   PT Stop Time 1429   PT Time Calculation (min) 41 min   Activity Tolerance Patient tolerated treatment well   Behavior During Therapy [facility] for tasks assessed/performed      Past Medical History  Diagnosis Date  . Anxiety   . Headache(784.0)     history migranes ocassionally  . Sciatica of right side 11/22/2015    Past Surgical History  Procedure Laterality Date  . Back surgery  04/2011    disc surgery  . Cysto      with dilation  . Urethral dilation N/A as child  . Cholecystectomy N/A 03/02/2014    Procedure: LAPAROSCOPIC CHOLECYSTECTOMY;  Surgeon: [name], MD;  Location: [facility];  Service: General;  Laterality: N/A;    There were no vitals filed for this visit.      Subjective Assessment - 03/26/16 1345    Subjective No current pain, does c/o Rt hip pain following sitting and standing up with sharp pain with certian movements.  Reports compliance with HEP daily, reports difficulty with sidelying hip abduction with increased pain and difficutly following 10 reps.     Pertinent History anxiety, R sided sciatica, history of back surgery    Patient Stated Goals get rid of pain, get better    Currently in Pain? No/denies              [facility] Adult PT Treatment/Exercise - 03/26/16 0001    Exercises   Exercises Lumbar   Lumbar Exercises:  Stretches   Active Hamstring Stretch 3 reps;30 seconds   Active Hamstring Stretch Limitations supine with rope   Single Knee to Chest Stretch 3 reps;20 seconds   Lower Trunk Rotation 5 reps;10 seconds   Lower Trunk Rotation Limitations with ab set   Lumbar Exercises: Supine   Ab Set 10 reps;5 seconds   Bent Knee Raise 10 reps;5 seconds   Bent Knee Raise Limitations with ab sets   Bridge 10 reps   Manual Therapy   Manual Therapy Muscle Energy Technique   Manual therapy comments Complete separate from rest of session   Muscle Energy Technique Lt SI anterior rotation f/b core strengthening exercises                  PT Short Term Goals - 03/18/16 1223    PT SHORT TERM GOAL #1   Title Patient to demonstrate lumbar ROM WFL in order to  assist in reducing pain and improving general function throughout her day    Time 3   Period Weeks   Status New   PT SHORT TERM GOAL #2   Title Patient to experience pain no more than 3/10 during functional transistions in order to improve overall mobility and QOL    Time 3   Period  Weeks   Status New   PT SHORT TERM GOAL #3   Title Patient to maintain correct posture at least 75% of the time in order to assist in reducing pain and improving general function throughout her day    Time 3   Period Weeks   Status New   PT SHORT TERM GOAL #4   Title Patient to be independent in correctly and consistently performing appropriate HEP, to be updated PRN    Time 3   Period Weeks   Status New           PT Long Term Goals - 03/18/16 1224    PT LONG TERM GOAL #1   Title Patient to demonstrate strength at least 4/5 in all tested muscle groups in order to assist in reducing pain adn improving fucntional task performance    Time 6   Period Weeks   Status New   PT LONG TERM GOAL #2   Title Patient to demonstrate correct lifting techniques in order to prevent exacerbation or recurrence of back pain during her daily functional activiities     Time 6   Period Weeks   Status New   PT LONG TERM GOAL #3   Title Patient to experience pain no more than 1/10 during all functional transitions and tasks in order to improve functional task performance and improve QOL    Time 6   Period Weeks   Status New   PT LONG TERM GOAL #4   Title Patient to report she has successfully been able to get up and down from the floor to play with her children with pain no more than 1/10 in order to improve overall QOL    Time 6   Period Weeks   Status New               Plan - 03/26/16 1407    Clinical Impression Statement Reviewed goals, compliance and technqiue with HEP and copy of evaluation given to pt.  Pt entered dept initially pain free, when began ab sets pt complained of increased lower back and right hip pain.  Assessed SI alignment with noted anterior Lt SI rotation.  Muscle energy technique complete with proper SI alignment following manual. Pt educated on importance of core strengtheing to assist with SI alignement, core stabilization exercises complete wtih min cueing to improve TrA activaiton and form.  Pt reports improved mobiltiy and decreased pain completeing with ab sets.  Functional stretches were complete to improve lumbar and hip mobility.  End of session pt reports pain reduced with imporoved gait mechanics following.     Rehab Potential Good   PT Frequency 2x / week   PT Duration 6 weeks   PT Treatment/Interventions ADLs/Self Care Home Management;Biofeedback;Cryotherapy;Moist Heat;Gait training;Stair training;Functional mobility training;Therapeutic activities;Therapeutic exercise;Balance training;Neuromuscular re-education;Patient/family education;Manual techniques;Taping   PT Next Visit Plan Check SI next session, MET PRN.  Progress to postural strengthening, funcitonal stretches and strengthening, lumbar and hip mobility.  Review sidelying abduction form and technique next session (time this session)      Patient will  benefit from skilled therapeutic intervention in order to improve the following deficits and impairments:  Abnormal gait, Hypomobility, Obesity, Decreased activity tolerance, Decreased strength, Pain, Difficulty walking, Decreased mobility, Improper body mechanics, Decreased coordination, Postural dysfunction  Visit Diagnosis: Right-sided low back pain with right-sided sciatica  Muscle weakness (generalized)  Abnormal posture  Difficulty in walking, not elsewhere classified     Problem List Patient Active Problem List  Diagnosis Date Noted  . Sciatica of right side 11/22/2015  . Diarrhea 01/27/2015  . Panic attacks 01/27/2015   Ihor Austin, LPTA; [place]  Aldona Lento 03/26/2016, 2:33 PM  [place] [address] [place], [place], [address] Phone: [phone]   Fax:  [phone]  Name: TAMAIYA BUMP MRN: [id number] Date of Birth: Jun 10, 1976

## 2016-03-28 ENCOUNTER — Encounter (HOSPITAL_COMMUNITY): Payer: BLUE CROSS/BLUE SHIELD

## 2016-04-02 ENCOUNTER — Ambulatory Visit (HOSPITAL_COMMUNITY): Payer: BLUE CROSS/BLUE SHIELD

## 2016-04-02 DIAGNOSIS — M6281 Muscle weakness (generalized): Secondary | ICD-10-CM

## 2016-04-02 DIAGNOSIS — R293 Abnormal posture: Secondary | ICD-10-CM

## 2016-04-02 DIAGNOSIS — M5441 Lumbago with sciatica, right side: Secondary | ICD-10-CM | POA: Diagnosis not present

## 2016-04-02 DIAGNOSIS — R262 Difficulty in walking, not elsewhere classified: Secondary | ICD-10-CM

## 2016-04-02 NOTE — Therapy (Signed)
[place], [place], [address] Phone: [phone]   Fax:  [phone]  Physical Therapy Treatment  Patient Details  Name: Tiffany Townsend MRN: [id number] Date of Birth: 09/29/1976 Referring Provider: Kary Kos   Encounter Date: 04/02/2016      PT End of Session - 04/02/16 1321    Visit Number 3   Number of Visits 12   Authorization Type BC[employer] (30 visit limit, 0 used at tmie of eval)   Authorization Time Period 03/18/16 to 04/29/16   Authorization - Visit Number 3   Authorization - Number of Visits 30   PT Start Time 1300   PT Stop Time 1346   PT Time Calculation (min) 46 min   Activity Tolerance Patient tolerated treatment well   Behavior During Therapy [facility] for tasks assessed/performed      Past Medical History  Diagnosis Date  . Anxiety   . Headache(784.0)     history migranes ocassionally  . Sciatica of right side 11/22/2015    Past Surgical History  Procedure Laterality Date  . Back surgery  04/2011    disc surgery  . Cysto      with dilation  . Urethral dilation N/A as child  . Cholecystectomy N/A 03/02/2014    Procedure: LAPAROSCOPIC CHOLECYSTECTOMY;  Surgeon: [name], MD;  Location: [facility];  Service: General;  Laterality: N/A;    There were no vitals filed for this visit.      Subjective Assessment - 04/02/16 1320    Subjective No current pain, reports she feels stiff today.  Reports decrased ability to place weight bearing on Rt foot over weekend   Pertinent History anxiety, R sided sciatica, history of back surgery    Patient Stated Goals get rid of pain, get better    Currently in Pain? No/denies             [facility] Adult PT Treatment/Exercise - 04/02/16 0001    Lumbar Exercises: Stretches   Lower Trunk Rotation 5 reps;10 seconds   Lower Trunk Rotation Limitations with ab set   Lumbar Exercises: Seated   Long Arc Quad on Chair Limitations   LAQ on Chair Weights (lbs) cervical  and scapular retraction 10x 5"   Lumbar Exercises: Supine   Ab Set 10 reps;5 seconds   AB Set Limitations 2 sets with cueing for form   Bent Knee Raise 10 reps;5 seconds   Bent Knee Raise Limitations with ab sets   Bridge 10 reps   Straight Leg Raise 10 reps   Straight Leg Raises Limitations core bracing with UE pressdown and cueing for ab set   Lumbar Exercises: Sidelying   Hip Abduction 10 reps   Hip Abduction Limitations cueing for form and to reduce hip flexion; complete infront of wall next session   Manual Therapy   Manual Therapy Muscle Energy Technique   Manual therapy comments Complete separate from rest of session   Muscle Energy Technique Rt SI anterior rotation f/b core strengthening exercises             PT Short Term Goals - 03/18/16 1223    PT SHORT TERM GOAL #1   Title Patient to demonstrate lumbar ROM WFL in order to  assist in reducing pain and improving general function throughout her day    Time 3   Period Weeks   Status New   PT SHORT TERM GOAL #2   Title Patient to experience pain  no more than 3/10 during functional transistions in order to improve overall mobility and QOL    Time 3   Period Weeks   Status New   PT SHORT TERM GOAL #3   Title Patient to maintain correct posture at least 75% of the time in order to assist in reducing pain and improving general function throughout her day    Time 3   Period Weeks   Status New   PT SHORT TERM GOAL #4   Title Patient to be independent in correctly and consistently performing appropriate HEP, to be updated PRN    Time 3   Period Weeks   Status New           PT Long Term Goals - 03/18/16 1224    PT LONG TERM GOAL #1   Title Patient to demonstrate strength at least 4/5 in all tested muscle groups in order to assist in reducing pain adn improving fucntional task performance    Time 6   Period Weeks   Status New   PT LONG TERM GOAL #2   Title Patient to demonstrate correct lifting techniques in  order to prevent exacerbation or recurrence of back pain during her daily functional activiities    Time 6   Period Weeks   Status New   PT LONG TERM GOAL #3   Title Patient to experience pain no more than 1/10 during all functional transitions and tasks in order to improve functional task performance and improve QOL    Time 6   Period Weeks   Status New   PT LONG TERM GOAL #4   Title Patient to report she has successfully been able to get up and down from the floor to play with her children with pain no more than 1/10 in order to improve overall QOL    Time 6   Period Weeks   Status New               Plan - 04/02/16 1325    Clinical Impression Statement Noted leg length discrepancy with decreased weight bearing to Rt LE during gait and decreased hip mobility.  Assess SI alignement with noted Rt SI anterior rotation.  Muscle energy technique complete to align SI and session focus on improving core stability.  Therapist facilitation to improve stability through session.  Added posture strengthening exercises and educated importance of posture awareness to reduce stress on spinal muscualture for pain control.  Reviewed sidelying abduction with moderate cueing to improve form and technqiue.  End of session pt reports decreased stiffness, no reports of pain and improved gait mechanics.     Rehab Potential Good   PT Frequency 2x / week   PT Duration 6 weeks   PT Treatment/Interventions ADLs/Self Care Home Management;Biofeedback;Cryotherapy;Moist Heat;Gait training;Stair training;Functional mobility training;Therapeutic activities;Therapeutic exercise;Balance training;Neuromuscular re-education;Patient/family education;Manual techniques;Taping   PT Next Visit Plan Check SI next session, MET PRN.  Progress to postural strengthening, funcitonal stretches and strengthening, lumbar and hip mobility.  Next session begin sidelying abduction infront of wall for better awarness of form wtih exercise.    PT Home Exercise Plan reviewed form with current HEP, no additiion exercises given today      Patient will benefit from skilled therapeutic intervention in order to improve the following deficits and impairments:  Abnormal gait, Hypomobility, Obesity, Decreased activity tolerance, Decreased strength, Pain, Difficulty walking, Decreased mobility, Improper body mechanics, Decreased coordination, Postural dysfunction  Visit Diagnosis: Right-sided low back pain with right-sided sciatica  Muscle weakness (generalized)  Abnormal posture  Difficulty in walking, not elsewhere classified     Problem List Patient Active Problem List   Diagnosis Date Noted  . Sciatica of right side 11/22/2015  . Diarrhea 01/27/2015  . Panic attacks 01/27/2015   Ihor Austin, LPTA; CBIS [phone]  Aldona Lento 04/02/2016, 5:13 PM  [place], [place], [address] Phone: [phone]   Fax:  [phone]  Name: ROSELY FERNANDEZ MRN: [id number] Date of Birth: 03-15-1976

## 2016-04-04 ENCOUNTER — Ambulatory Visit (HOSPITAL_COMMUNITY): Payer: BLUE CROSS/BLUE SHIELD | Admitting: Physical Therapy

## 2016-04-04 DIAGNOSIS — M5441 Lumbago with sciatica, right side: Secondary | ICD-10-CM | POA: Diagnosis not present

## 2016-04-04 DIAGNOSIS — R262 Difficulty in walking, not elsewhere classified: Secondary | ICD-10-CM

## 2016-04-04 DIAGNOSIS — M6281 Muscle weakness (generalized): Secondary | ICD-10-CM

## 2016-04-04 DIAGNOSIS — R293 Abnormal posture: Secondary | ICD-10-CM

## 2016-04-04 NOTE — Patient Instructions (Signed)
   HIP FLEXOR STRETCH  While lying on a table or high bed, let the affected leg lower towards the floor until a stretch is felt along the front of your thigh.  Hold for 30 seconds, twice each side, twice a day.

## 2016-04-04 NOTE — Therapy (Signed)
[place], [place], [address] Phone: [phone]   Fax:  [phone]  Physical Therapy Treatment  Patient Details  Name: Tiffany Townsend MRN: [id number] Date of Birth: 1976-11-15 Referring Provider: Kary Kos   Encounter Date: 04/04/2016      PT End of Session - 04/04/16 1414    Visit Number 4   Number of Visits 12   Date for PT Re-Evaluation 04/08/16   Authorization Type BCBS (30 visit limit, 0 used at tmie of eval)   Authorization Time Period 03/18/16 to 04/29/16   Authorization - Visit Number 4   Authorization - Number of Visits 30   PT Start Time 1302   PT Stop Time 1341   PT Time Calculation (min) 39 min   Activity Tolerance Patient tolerated treatment well   Behavior During Therapy [facility] for tasks assessed/performed      Past Medical History  Diagnosis Date  . Anxiety   . Headache(784.0)     history migranes ocassionally  . Sciatica of right side 11/22/2015    Past Surgical History  Procedure Laterality Date  . Back surgery  04/2011    disc surgery  . Cysto      with dilation  . Urethral dilation N/A as child  . Cholecystectomy N/A 03/02/2014    Procedure: LAPAROSCOPIC CHOLECYSTECTOMY;  Surgeon: [name], MD;  Location: [facility];  Service: General;  Laterality: N/A;    There were no vitals filed for this visit.      Subjective Assessment - 04/04/16 1304    Subjective Patient reports that she is a little sore today, nothing in particular spiked it but she says that sometimes it just acts up. Nothing else major going on.    Pertinent History anxiety, R sided sciatica, history of back surgery    Currently in Pain? Yes   Pain Score 4    Pain Location Hip   Pain Orientation Right   Pain Descriptors / Indicators Aching   Pain Radiating Towards none    Pain Onset More than a month ago   Pain Frequency Constant   Aggravating Factors  walking, it acts up on itself from time to time    Pain  Relieving Factors nothing    Effect of Pain on Daily Activities makes it hard for her to walk                          [facility] Adult PT Treatment/Exercise - 04/04/16 0001    Lumbar Exercises: Stretches   Active Hamstring Stretch 3 reps;30 seconds   Active Hamstring Stretch Limitations 12 inch box    Passive Hamstring Stretch 3 reps;30 seconds   Passive Hamstring Stretch Limitations gastroc on slantboard    Single Knee to Chest Stretch 5 reps   Single Knee to Chest Stretch Limitations 10 seconds   Lower Trunk Rotation 5 reps;10 seconds   Lower Trunk Rotation Limitations core squeeze    Piriformis Stretch 2 reps;30 seconds   Piriformis Stretch Limitations seated    Lumbar Exercises: Supine   Ab Set 15 reps   AB Set Limitations 3 second holds    Bent Knee Raise 10 reps;1 second   Bent Knee Raise Limitations with ab sets, UE pushdown    Bridge 10 reps   Straight Leg Raise 10 reps   Straight Leg Raises Limitations core bracing with UE pressdown and cueing for ab set  Other Supine Lumbar Exercises thomas stretch for hip flexors 2x30 seconds    Other Supine Lumbar Exercises supine hp abduction with red TB 1x10 B    Manual Therapy   Manual Therapy Muscle Energy Technique   Manual therapy comments Complete separate from rest of session   Muscle Energy Technique L anterior SI correction; minimal improvement with MET today                 PT Education - 04/04/16 1414    Education provided No          PT Short Term Goals - 03/18/16 1223    PT SHORT TERM GOAL #1   Title Patient to demonstrate lumbar ROM WFL in order to  assist in reducing pain and improving general function throughout her day    Time 3   Period Weeks   Status New   PT SHORT TERM GOAL #2   Title Patient to experience pain no more than 3/10 during functional transistions in order to improve overall mobility and QOL    Time 3   Period Weeks   Status New   PT SHORT TERM GOAL #3   Title  Patient to maintain correct posture at least 75% of the time in order to assist in reducing pain and improving general function throughout her day    Time 3   Period Weeks   Status New   PT SHORT TERM GOAL #4   Title Patient to be independent in correctly and consistently performing appropriate HEP, to be updated PRN    Time 3   Period Weeks   Status New           PT Long Term Goals - 03/18/16 1224    PT LONG TERM GOAL #1   Title Patient to demonstrate strength at least 4/5 in all tested muscle groups in order to assist in reducing pain adn improving fucntional task performance    Time 6   Period Weeks   Status New   PT LONG TERM GOAL #2   Title Patient to demonstrate correct lifting techniques in order to prevent exacerbation or recurrence of back pain during her daily functional activiities    Time 6   Period Weeks   Status New   PT LONG TERM GOAL #3   Title Patient to experience pain no more than 1/10 during all functional transitions and tasks in order to improve functional task performance and improve QOL    Time 6   Period Weeks   Status New   PT LONG TERM GOAL #4   Title Patient to report she has successfully been able to get up and down from the floor to play with her children with pain no more than 1/10 in order to improve overall QOL    Time 6   Period Weeks   Status New               Plan - 04/04/16 1310    Clinical Impression Statement Began session with functional stretching, progressed to functional core stabilization and strengthening with emphasis on core activation during all exercise, as well as good posture. Monitored patient for exacerbation of symptoms throughout session today, and adjusted as needed.  Introduced piriformis and thomas stretches today with moderate soft tissue impairemnt noted in both, and good tolerance of  both new stretches noted.  ATtempted to correct L anterior rotation with MET today, however noted only minimal improvement.  Finished session with core stablization exercises-  performed hip abudction in supine with red TB today instead of on wall due to space availability.    Rehab Potential Good   PT Frequency 2x / week   PT Duration 6 weeks   PT Treatment/Interventions ADLs/Self Care Home Management;Biofeedback;Cryotherapy;Moist Heat;Gait training;Stair training;Functional mobility training;Therapeutic activities;Therapeutic exercise;Balance training;Neuromuscular re-education;Patient/family education;Manual techniques;Taping   PT Next Visit Plan Continue Marcello Moores and Piriformis stretches. Check SI next session, MET PRN.  Progress to postural strengthening, funcitonal stretches and strengthening, lumbar and hip mobility.  Next session begin sidelying abduction infront of wall for better awarness of form wtih exercise.   PT Home Exercise Plan reviewed form with current HEP, no additiion exercises given today   Consulted and Agree with Plan of Care Patient      Patient will benefit from skilled therapeutic intervention in order to improve the following deficits and impairments:  Abnormal gait, Hypomobility, Obesity, Decreased activity tolerance, Decreased strength, Pain, Difficulty walking, Decreased mobility, Improper body mechanics, Decreased coordination, Postural dysfunction  Visit Diagnosis: Right-sided low back pain with right-sided sciatica  Muscle weakness (generalized)  Abnormal posture  Difficulty in walking, not elsewhere classified     Problem List Patient Active Problem List   Diagnosis Date Noted  . Sciatica of right side 11/22/2015  . Diarrhea 01/27/2015  . Panic attacks 01/27/2015    Deniece Ree PT, DPT [place] [address] [place], [place], [address] Phone: [phone]   Fax:  [phone]  Name: Tiffany Townsend MRN: [id number] Date of Birth: Apr 06, 1976

## 2016-04-08 ENCOUNTER — Telehealth (HOSPITAL_COMMUNITY): Payer: Self-pay

## 2016-04-08 NOTE — Telephone Encounter (Signed)
04/08/16 we cancelled the 5/23 appt because therapist was sick and rescheduled for 5/26

## 2016-04-09 ENCOUNTER — Encounter (HOSPITAL_COMMUNITY): Payer: BLUE CROSS/BLUE SHIELD | Admitting: Physical Therapy

## 2016-04-11 ENCOUNTER — Ambulatory Visit (HOSPITAL_COMMUNITY): Payer: BLUE CROSS/BLUE SHIELD

## 2016-04-11 DIAGNOSIS — R293 Abnormal posture: Secondary | ICD-10-CM

## 2016-04-11 DIAGNOSIS — M6281 Muscle weakness (generalized): Secondary | ICD-10-CM

## 2016-04-11 DIAGNOSIS — M5441 Lumbago with sciatica, right side: Secondary | ICD-10-CM

## 2016-04-11 DIAGNOSIS — R262 Difficulty in walking, not elsewhere classified: Secondary | ICD-10-CM

## 2016-04-11 NOTE — Therapy (Addendum)
PHYSICAL THERAPY DISCHARGE SUMMARY  Visits from Start of Care: 5  Current functional level related to goals / functional outcomes: Short term goals partially met, long term goals not met.    Remaining deficits: *see below    Education / Equipment: *see below  Plan: Patient agrees to discharge.  Patient goals were not met. Patient is being discharged due to not returning since the last visit.  ?????         3:04 PM, 08/13/18 [name], PT, DPT Physical Therapist at [place] [phone] (office)          [place], [place], [address] Phone: [phone]   Fax:  [phone]  Physical Therapy Treatment  Patient Details  Name: Tiffany Townsend MRN: [id number] Date of Birth: 02/23/1976 Referring Provider: Kary Kos   Encounter Date: 04/11/2016      PT End of Session - 04/11/16 1058    Visit Number 5   Number of Visits 12   Date for PT Re-Evaluation 04/08/16   Authorization Type [employer] (30 visit limit, 0 used at tmie of eval)   Authorization - Visit Number 5   Authorization - Number of Visits 30   PT Start Time 3536   PT Stop Time 1110   PT Time Calculation (min) 38 min   Activity Tolerance Patient tolerated treatment well   Behavior During Therapy [facility] for tasks assessed/performed      Past Medical History  Diagnosis Date  . Anxiety   . Headache(784.0)     history migranes ocassionally  . Sciatica of right side 11/22/2015    Past Surgical History  Procedure Laterality Date  . Back surgery  04/2011    disc surgery  . Cysto      with dilation  . Urethral dilation N/A as child  . Cholecystectomy N/A 03/02/2014    Procedure: LAPAROSCOPIC CHOLECYSTECTOMY;  Surgeon: [name], MD;  Location: [facility];  Service: General;  Laterality: N/A;    There were no vitals filed for this visit.      Subjective Assessment - 04/11/16 1034    Subjective Patient  reports she has been doing well, no updates to offer. She reports HEP is going well.    Currently in Pain? Yes   Pain Score 3    Pain Location Hip   Pain Orientation Right;Anterior   Pain Descriptors / Indicators Aching   Pain Type Chronic pain   Pain Onset More than a month ago                         [facility] Adult PT Treatment/Exercise - 04/11/16 0001    Exercises   Exercises Neck   Neck Exercises: Supine   Neck Retraction 10 reps;3 secs  into pillows on perpendicular 6" foam roll   Lumbar Exercises: Stretches   Active Hamstring Stretch 5 reps;10 seconds  bilat   Active Hamstring Stretch Limitations supine, c active quads  lumbar towel roll, 5x10 sec   Single Knee to Chest Stretch 2 reps;30 seconds   Lower Trunk Rotation 5 reps;10 seconds  bilat   Lower Trunk Rotation Limitations core squeeze    Lumbar Exercises: Supine   Ab Set 10 reps   AB Set Limitations paired with belly breathing to cue transverse abd   Bent Knee Raise 10 reps;1 second  alteranting L/R (20x total), difficulty pairing c abd  Bent Knee Raise Limitations with ab sets, UE pushdown    Bridge 20 reps  2x10 with    Straight Leg Raise 20 reps  1x10 bilat   Straight Leg Raises Limitations reviewed cues for pairing with Abd set, which helpe   Other Supine Lumbar Exercises supine hp abduction with red TB 2x10 B                   PT Short Term Goals - 04/11/16 1103    PT SHORT TERM GOAL #1   Title Patient to demonstrate lumbar ROM WFL in order to  assist in reducing pain and improving general function throughout her day    Baseline at 3 weeks, flexion remains somewhat limited, especially AROM   Status On-going   PT SHORT TERM GOAL #2   Title Patient to experience pain no more than 3/10 during functional transistions in order to improve overall mobility and QOL    Baseline at three weeks, exiting car remains most fdifficult with R anterior pelvic locking and severe sudden pain.     Time 3   Status On-going   PT SHORT TERM GOAL #3   Title Patient to maintain correct posture at least 75% of the time in order to assist in reducing pain and improving general function throughout her day    Baseline requires some cues to remember but typically is successful   Status Achieved   PT SHORT TERM GOAL #4   Title Patient to be independent in correctly and consistently performing appropriate HEP, to be updated PRN    Status Achieved           PT Long Term Goals - 03/18/16 1224    PT LONG TERM GOAL #1   Title Patient to demonstrate strength at least 4/5 in all tested muscle groups in order to assist in reducing pain adn improving fucntional task performance    Time 6   Period Weeks   Status New   PT LONG TERM GOAL #2   Title Patient to demonstrate correct lifting techniques in order to prevent exacerbation or recurrence of back pain during her daily functional activiities    Time 6   Period Weeks   Status New   PT LONG TERM GOAL #3   Title Patient to experience pain no more than 1/10 during all functional transitions and tasks in order to improve functional task performance and improve QOL    Time 6   Period Weeks   Status New   PT LONG TERM GOAL #4   Title Patient to report she has successfully been able to get up and down from the floor to play with her children with pain no more than 1/10 in order to improve overall QOL    Time 6   Period Weeks   Status New               Plan - 04/11/16 1059    Clinical Impression Statement Pt making progress toward goals, although her pain remains unchanged from day to day, she reports it to have improved since starting therapy. All exercises were progressed today, with continued emphasis on abdominal sets for isolated activation of deep abdominal muscles.    Rehab Potential Good   PT Frequency 2x / week   PT Duration 6 weeks   PT Treatment/Interventions ADLs/Self Care Home Management;Biofeedback;Cryotherapy;Moist  Heat;Gait training;Stair training;Functional mobility training;Therapeutic activities;Therapeutic exercise;Balance training;Neuromuscular re-education;Patient/family education;Manual techniques;Taping   PT Next Visit Plan Review HEP and update,  continue to progress core stabilization.    Consulted and Agree with Plan of Care Patient      Patient will benefit from skilled therapeutic intervention in order to improve the following deficits and impairments:  Abnormal gait, Hypomobility, Obesity, Decreased activity tolerance, Decreased strength, Pain, Difficulty walking, Decreased mobility, Improper body mechanics, Decreased coordination, Postural dysfunction  Visit Diagnosis: Right-sided low back pain with right-sided sciatica  Muscle weakness (generalized)  Abnormal posture  Difficulty in walking, not elsewhere classified     Problem List Patient Active Problem List   Diagnosis Date Noted  . Sciatica of right side 11/22/2015  . Diarrhea 01/27/2015  . Panic attacks 01/27/2015    11:12 AM, 04/11/2016 [name], PT, DPT PRN Physical Therapist at [place] # [id number] [phone] (office)     [place], [place], [address] Phone: [phone]   Fax:  [phone]  Name: DONIELLE KAIGLER MRN: [id number] Date of Birth: 1976-06-22

## 2016-04-12 ENCOUNTER — Telehealth (HOSPITAL_COMMUNITY): Payer: Self-pay

## 2016-04-12 ENCOUNTER — Ambulatory Visit (HOSPITAL_COMMUNITY): Payer: BLUE CROSS/BLUE SHIELD

## 2016-04-12 NOTE — Telephone Encounter (Signed)
No show, called and left message informing missed apt as well as reminded next apt date and time with contact info given.  [address] [place]; CBIS [phone]

## 2016-04-16 ENCOUNTER — Encounter (HOSPITAL_COMMUNITY): Payer: BLUE CROSS/BLUE SHIELD | Admitting: Physical Therapy

## 2016-04-16 ENCOUNTER — Telehealth (HOSPITAL_COMMUNITY): Payer: Self-pay

## 2016-04-16 NOTE — Telephone Encounter (Signed)
04/16/16 husband called to say that she wouldn't be making it in today

## 2016-04-17 ENCOUNTER — Telehealth (HOSPITAL_COMMUNITY): Payer: Self-pay

## 2016-04-17 ENCOUNTER — Encounter: Payer: Self-pay | Admitting: Family Medicine

## 2016-04-17 ENCOUNTER — Ambulatory Visit (INDEPENDENT_AMBULATORY_CARE_PROVIDER_SITE_OTHER): Payer: BLUE CROSS/BLUE SHIELD | Admitting: Family Medicine

## 2016-04-17 VITALS — BP 108/80 | Temp 98.5°F | Ht 64.0 in | Wt 219.4 lb

## 2016-04-17 DIAGNOSIS — R1013 Epigastric pain: Secondary | ICD-10-CM

## 2016-04-17 MED ORDER — PANTOPRAZOLE SODIUM 40 MG PO TBEC: 40.0000 mg | DELAYED_RELEASE_TABLET | Freq: Every day | ORAL | Status: DC

## 2016-04-17 MED ORDER — HYOSCYAMINE SULFATE 0.125 MG SL SUBL: 0.1250 mg | SUBLINGUAL_TABLET | SUBLINGUAL | Status: DC | PRN

## 2016-04-17 NOTE — Progress Notes (Signed)
   Subjective:    Patient ID: Tiffany Townsend, female    DOB: 06/15/1976, 40 y.o.   MRN: [id number]  Abdominal Pain This is a new problem. The current episode started in the past 7 days. The problem occurs intermittently. The problem has been unchanged. The pain is located in the generalized abdominal region. The pain is at a severity of 10/10. The pain is severe. The quality of the pain is aching. The abdominal pain radiates to the epigastric region. Nothing aggravates the pain. The pain is relieved by nothing. Treatments tried: Miralax. The treatment provided no relief.   Patient states that she has no other concerns at this time.   Review of Systems  Gastrointestinal: Positive for abdominal pain.  Relate some nausea no vomiting denies rectal bleeding. Did have some constipation but also has had some intermittent loose stools.     Objective:   Physical Exam Lungs clear hearts regular moderate epigastric pain middle abdominal pain no lower tenderness extremities no edema skin warm dry       Assessment & Plan:  Significant abdominal pain may use Levsin/SL as needed for the cramping also start PPI. In addition this do lab work await the results of this. If patient not having significant improvement over the next 7-10 days consideration for EGD and GI referral warning signs were discussed with the patient detail

## 2016-04-17 NOTE — Telephone Encounter (Signed)
04/17/16 cx she is still sick and the dr told her to not come to therapy tomorrow

## 2016-04-18 ENCOUNTER — Encounter (HOSPITAL_COMMUNITY): Payer: BLUE CROSS/BLUE SHIELD

## 2016-04-18 LAB — CBC WITH DIFFERENTIAL/PLATELET
BASOS: 0 %
Basophils Absolute: 0 10*3/uL (ref 0.0–0.2)
EOS (ABSOLUTE): 0.1 10*3/uL (ref 0.0–0.4)
EOS: 1 %
HEMATOCRIT: 48.7 % — AB (ref 34.0–46.6)
Hemoglobin: 15.7 g/dL (ref 11.1–15.9)
IMMATURE GRANS (ABS): 0 10*3/uL (ref 0.0–0.1)
IMMATURE GRANULOCYTES: 0 %
LYMPHS: 25 %
Lymphocytes Absolute: 2.2 10*3/uL (ref 0.7–3.1)
MCH: 29.3 pg (ref 26.6–33.0)
MCHC: 32.2 g/dL (ref 31.5–35.7)
MCV: 91 fL (ref 79–97)
Monocytes Absolute: 0.6 10*3/uL (ref 0.1–0.9)
Monocytes: 6 %
NEUTROS PCT: 68 %
Neutrophils Absolute: 6.1 10*3/uL (ref 1.4–7.0)
PLATELETS: 273 10*3/uL (ref 150–379)
RBC: 5.36 x10E6/uL — AB (ref 3.77–5.28)
RDW: 14.1 % (ref 12.3–15.4)
WBC: 9.1 10*3/uL (ref 3.4–10.8)

## 2016-04-18 LAB — BASIC METABOLIC PANEL
BUN/Creatinine Ratio: 8 — ABNORMAL LOW (ref 9–23)
BUN: 8 mg/dL (ref 6–20)
CALCIUM: 9.6 mg/dL (ref 8.7–10.2)
CHLORIDE: 99 mmol/L (ref 96–106)
CO2: 20 mmol/L (ref 18–29)
Creatinine, Ser: 0.95 mg/dL (ref 0.57–1.00)
GFR calc non Af Amer: 76 mL/min/{1.73_m2} (ref >59)
GFR, EST AFRICAN AMERICAN: 87 mL/min/{1.73_m2} (ref >59)
Glucose: 90 mg/dL (ref 65–99)
POTASSIUM: 4.5 mmol/L (ref 3.5–5.2)
SODIUM: 139 mmol/L (ref 134–144)

## 2016-04-18 LAB — HEPATIC FUNCTION PANEL
ALBUMIN: 4.6 g/dL (ref 3.5–5.5)
ALT: 25 IU/L (ref 0–32)
AST: 17 IU/L (ref 0–40)
Alkaline Phosphatase: 63 IU/L (ref 39–117)
Bilirubin Total: 1.4 mg/dL — ABNORMAL HIGH (ref 0.0–1.2)
Bilirubin, Direct: 0.32 mg/dL (ref 0.00–0.40)
TOTAL PROTEIN: 7.4 g/dL (ref 6.0–8.5)

## 2016-04-18 LAB — MAGNESIUM: Magnesium: 2.1 mg/dL (ref 1.6–2.3)

## 2016-04-18 LAB — LIPASE: Lipase: 31 U/L (ref 0–59)

## 2016-04-23 ENCOUNTER — Encounter (HOSPITAL_COMMUNITY): Payer: BLUE CROSS/BLUE SHIELD | Admitting: Physical Therapy

## 2016-04-23 ENCOUNTER — Telehealth (HOSPITAL_COMMUNITY): Payer: Self-pay | Admitting: Physical Therapy

## 2016-04-23 NOTE — Telephone Encounter (Signed)
Pt did not show for appointment.  Left message regarding contacting clinic and of next appt on Thursday at 10:30.   Teena Irani, PTA/CLT [phone]

## 2016-04-24 ENCOUNTER — Telehealth: Payer: Self-pay | Admitting: Family Medicine

## 2016-04-24 ENCOUNTER — Telehealth (HOSPITAL_COMMUNITY): Payer: Self-pay | Admitting: Physical Therapy

## 2016-04-24 NOTE — Telephone Encounter (Signed)
Last got #60 03/08/16

## 2016-04-24 NOTE — Telephone Encounter (Signed)
She may have rx for 35 tablets will need follow up ov later in June for additional if needed. As for her abdomen sx if she is not doing better with that then please set up referral to GI

## 2016-04-24 NOTE — Telephone Encounter (Signed)
Requesting Rx for oxyCODONE-acetaminophen (PERCOCET) 7.5-325 MG tablet. °

## 2016-04-24 NOTE — Telephone Encounter (Signed)
[place] to see 

## 2016-04-24 NOTE — Telephone Encounter (Signed)
SHe is having stomach issues and will not come back for PT until that is taken care of.

## 2016-04-25 ENCOUNTER — Encounter (HOSPITAL_COMMUNITY): Payer: BLUE CROSS/BLUE SHIELD | Admitting: Physical Therapy

## 2016-04-25 MED ORDER — OXYCODONE-ACETAMINOPHEN 7.5-325 MG PO TABS: 1.0000 | ORAL_TABLET | ORAL | Status: DC | PRN

## 2016-04-25 NOTE — Telephone Encounter (Signed)
Prescription up front for pick up. Left message to return call 

## 2016-04-30 ENCOUNTER — Encounter (HOSPITAL_COMMUNITY): Payer: BLUE CROSS/BLUE SHIELD | Admitting: Physical Therapy

## 2016-05-02 ENCOUNTER — Encounter (HOSPITAL_COMMUNITY): Payer: BLUE CROSS/BLUE SHIELD | Admitting: Physical Therapy

## 2016-05-07 ENCOUNTER — Encounter (HOSPITAL_COMMUNITY): Payer: BLUE CROSS/BLUE SHIELD

## 2016-05-08 ENCOUNTER — Encounter (HOSPITAL_COMMUNITY): Payer: BLUE CROSS/BLUE SHIELD

## 2016-05-14 ENCOUNTER — Encounter (HOSPITAL_COMMUNITY): Payer: BLUE CROSS/BLUE SHIELD | Admitting: Physical Therapy

## 2016-05-16 ENCOUNTER — Encounter (HOSPITAL_COMMUNITY): Payer: BLUE CROSS/BLUE SHIELD | Admitting: Physical Therapy

## 2016-08-06 ENCOUNTER — Other Ambulatory Visit: Payer: Self-pay | Admitting: Family Medicine

## 2016-08-06 MED ORDER — ALPRAZOLAM 0.5 MG PO TABS: ORAL_TABLET | ORAL | 1 refills | Status: DC

## 2016-08-06 NOTE — Progress Notes (Signed)
Pt requested refill, this was given

## 2016-08-29 ENCOUNTER — Encounter: Payer: Self-pay | Admitting: Nurse Practitioner

## 2016-08-29 ENCOUNTER — Ambulatory Visit (INDEPENDENT_AMBULATORY_CARE_PROVIDER_SITE_OTHER): Payer: BLUE CROSS/BLUE SHIELD | Admitting: Nurse Practitioner

## 2016-08-29 VITALS — Ht 64.0 in | Wt 217.6 lb

## 2016-08-29 DIAGNOSIS — N63 Unspecified lump in unspecified breast: Secondary | ICD-10-CM

## 2016-08-29 DIAGNOSIS — N939 Abnormal uterine and vaginal bleeding, unspecified: Secondary | ICD-10-CM

## 2016-08-30 ENCOUNTER — Encounter: Payer: Self-pay | Admitting: Nurse Practitioner

## 2016-08-30 NOTE — Progress Notes (Signed)
Subjective:  Presents of a nodule in her right breast first noticed a few weeks ago. It went away for a while, had a hard time finding area. Has been back for the past few days. Very irregular menstrual cycle. Is not currently on any hormonal birth control. Family history: Has 1 paternal cousin with breast cancer otherwise history is negative.  Objective:   Ht 5\' 4"  (1.626 m)   Wt 217 lb 9.6 oz (98.7 kg)   BMI 37.35 kg/m  NAD. Alert, oriented. Left breast minimal nodularity, axilla no adenopathy. Right breast has multiple fine nodularity along the right lower part of the breast. One small 5 mm mobile slightly firm nodule noted near the outer margin of the breast approximately 5:00. Axilla no adenopathy  Assessment: Breast nodule - Plan: US BREAST LTD UNI LEFT INC AXILLA, US BREAST LTD UNI RIGHT INC AXILLA, MM DIAG BREAST TOMO BILATERAL  Abnormal uterine bleeding   Plan: Explained this is most likely due to hormonal changes and these are normal fibrocystic areas. However patient is 15 and already due for her first screening mammogram, radiology notified back area and right breast, ultrasound ordered. Further follow-up based on results.

## 2016-09-03 ENCOUNTER — Ambulatory Visit (HOSPITAL_COMMUNITY): Admission: RE | Admit: 2016-09-03 | Payer: BLUE CROSS/BLUE SHIELD | Source: Ambulatory Visit

## 2016-09-03 ENCOUNTER — Ambulatory Visit (HOSPITAL_COMMUNITY)
Admission: RE | Admit: 2016-09-03 | Discharge: 2016-09-03 | Disposition: A | Discharge status: Home / Self Care | Payer: BLUE CROSS/BLUE SHIELD | Source: Ambulatory Visit | Attending: Nurse Practitioner | Admitting: Nurse Practitioner

## 2016-09-03 ENCOUNTER — Encounter (HOSPITAL_COMMUNITY): Payer: Self-pay

## 2016-09-03 ENCOUNTER — Other Ambulatory Visit: Payer: Self-pay | Admitting: Nurse Practitioner

## 2016-09-03 DIAGNOSIS — N6002 Solitary cyst of left breast: Secondary | ICD-10-CM | POA: Diagnosis not present

## 2016-09-03 DIAGNOSIS — N63 Unspecified lump in unspecified breast: Secondary | ICD-10-CM

## 2016-09-03 DIAGNOSIS — N6489 Other specified disorders of breast: Secondary | ICD-10-CM | POA: Diagnosis not present

## 2016-09-19 IMAGING — MR MR LUMBAR SPINE WO/W CM
4 of 8 series · 13 of 48 positions shown · IV contrast (20ml Multihance)
Comparison: None.

CLINICAL DATA: Low back pain with extension into both lower
extremities. Weakness and numbness. Previous surgery.

EXAM:
MRI LUMBAR SPINE WITHOUT AND WITH CONTRAST
TECHNIQUE: Multiplanar and multiecho pulse sequences of the lumbar spine were
obtained without and with intravenous contrast.
CONTRAST:  20mL MULTIHANCE GADOBENATE DIMEGLUMINE 529 MG/ML IV SOLN

[Series 3: T1 · sagittal · 4.0mm · 0.35mm/px · 3 of 13 slices shown]
[im 1/13]
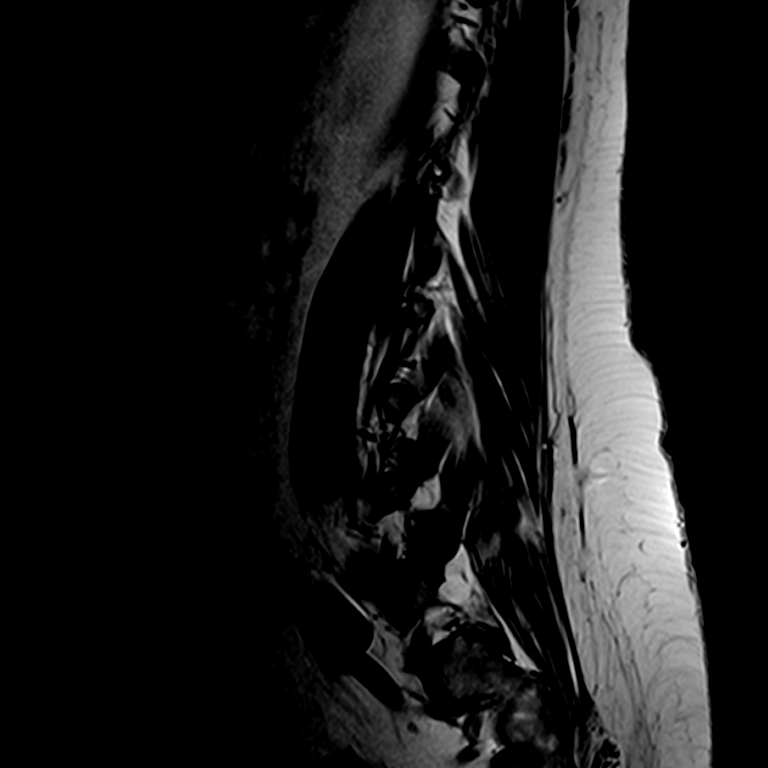
[im 9/13]
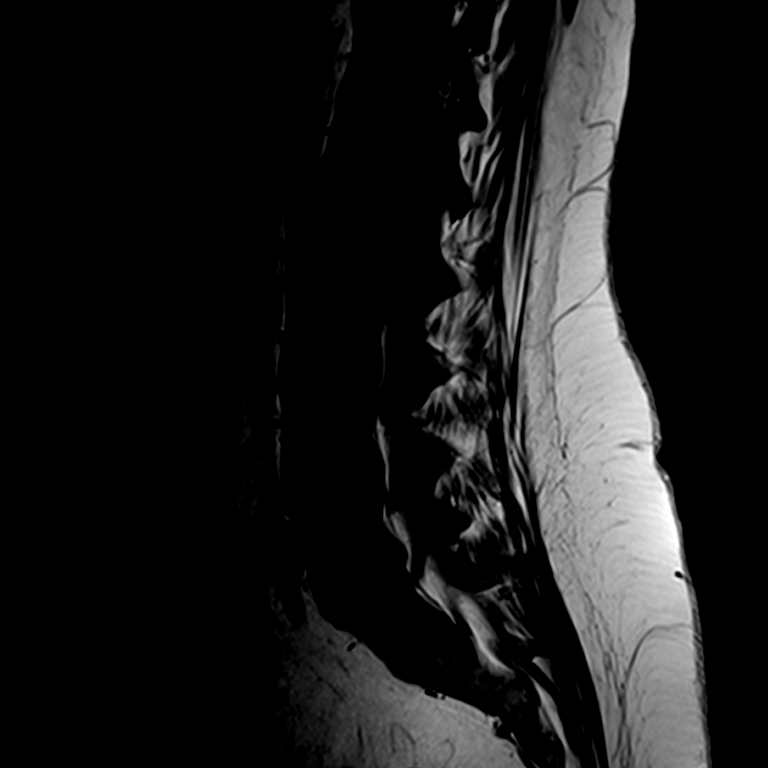
[im 13/13]
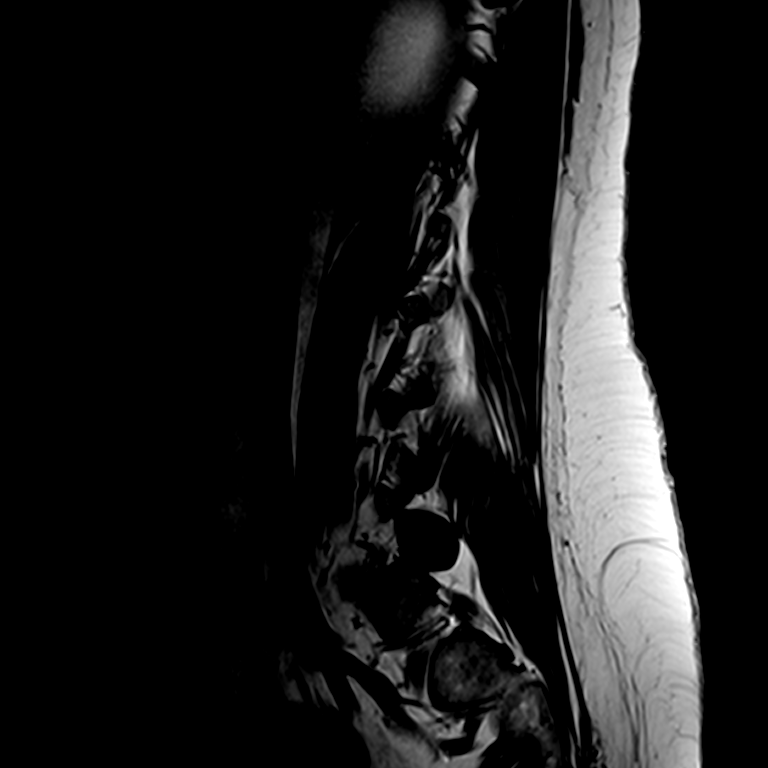

[Series 5: T2 · axial · 4.0mm · 0.20mm/px · z∈[-147,+24]mm · 4 of 40 slices shown (1 of 2)]
[im 1/40]
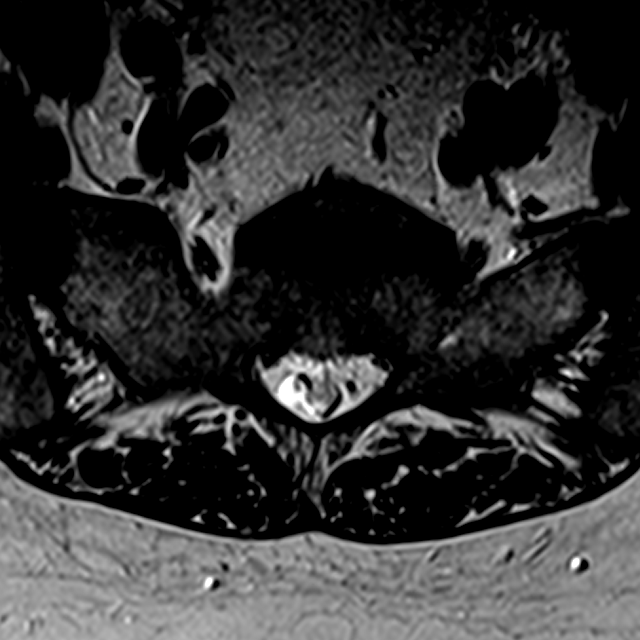
[im 5/40]
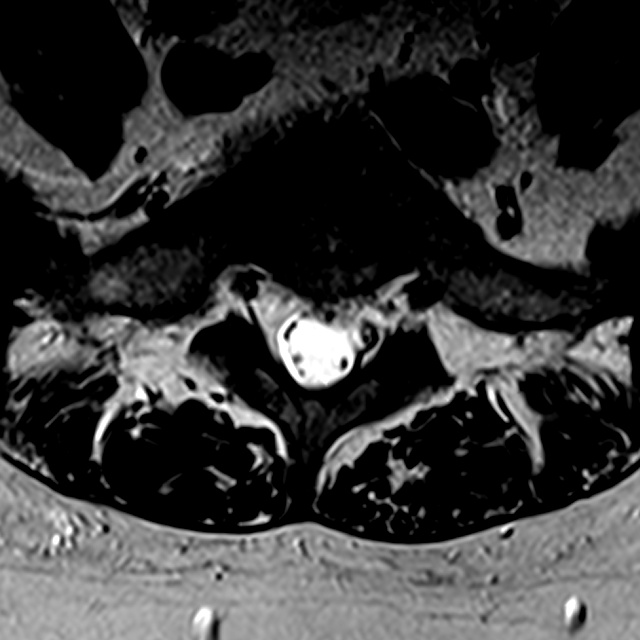
[im 22/40]
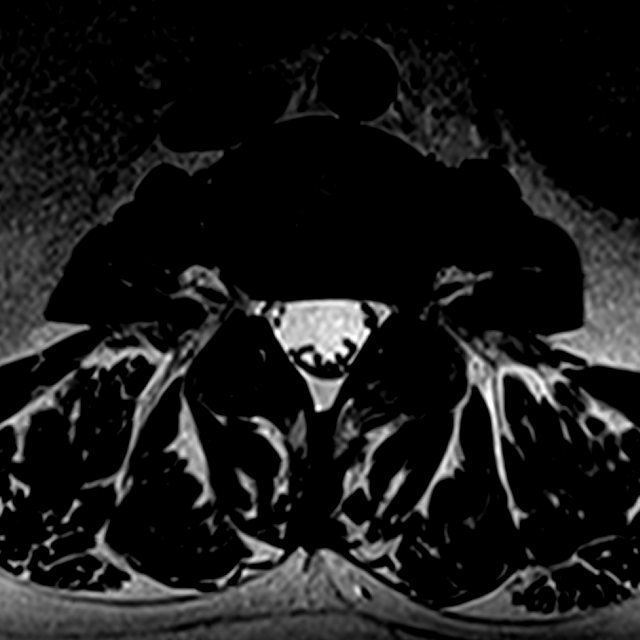
[im 35/40]
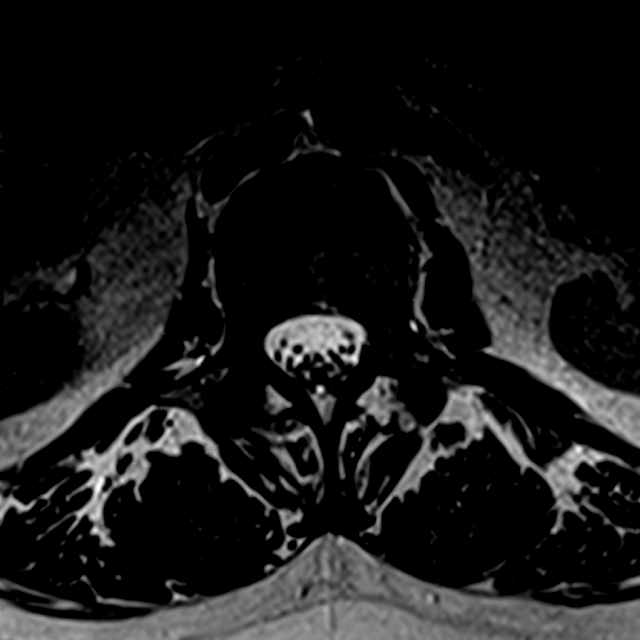

[Series 7: T2 post-contrast · sagittal · 4.0mm · 0.73mm/px · 3 of 15 slices shown]
[im 1/15]
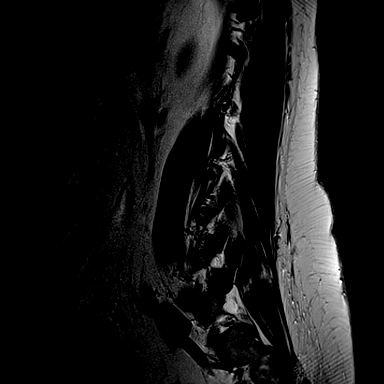
[im 10/15]
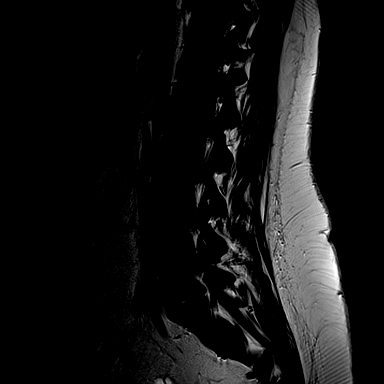
[im 15/15]
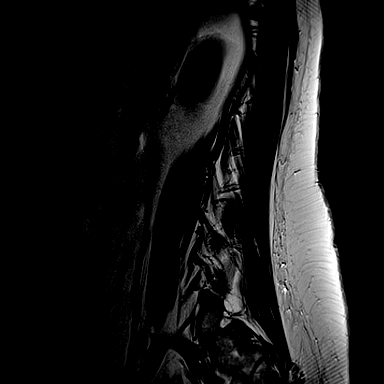

[Series 10: T2 · sagittal · 4.0mm · 0.44mm/px · 3 of 15 slices shown (2 of 2)]
[im 1/15]
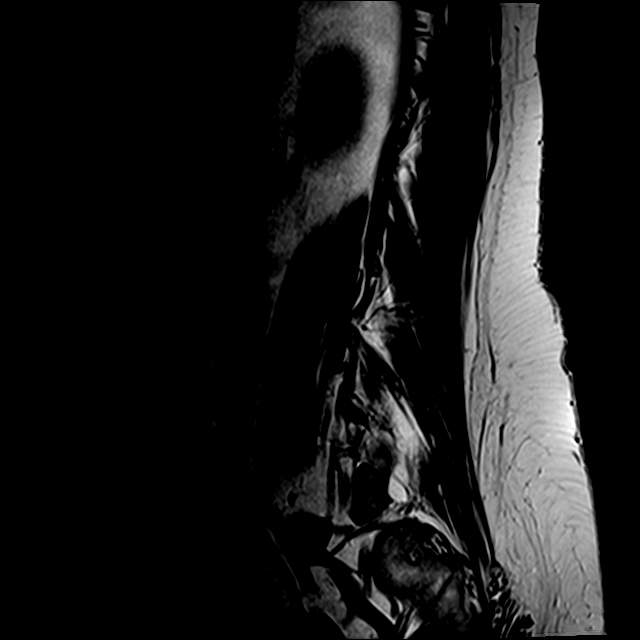
[im 10/15]
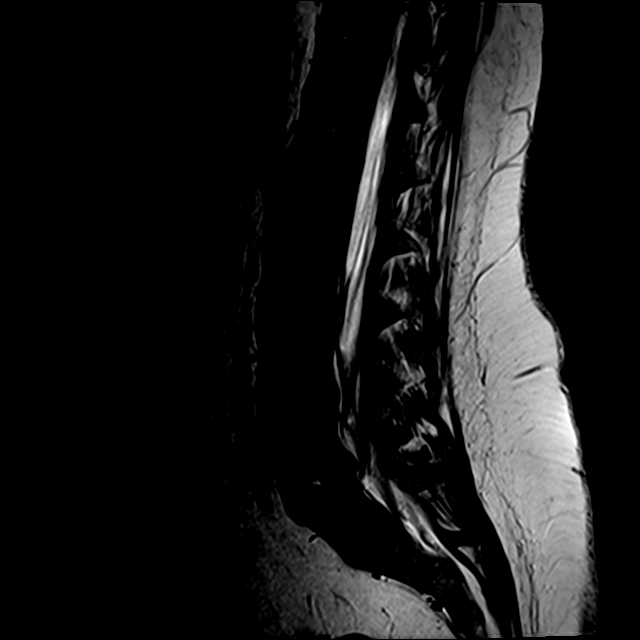
[im 15/15]
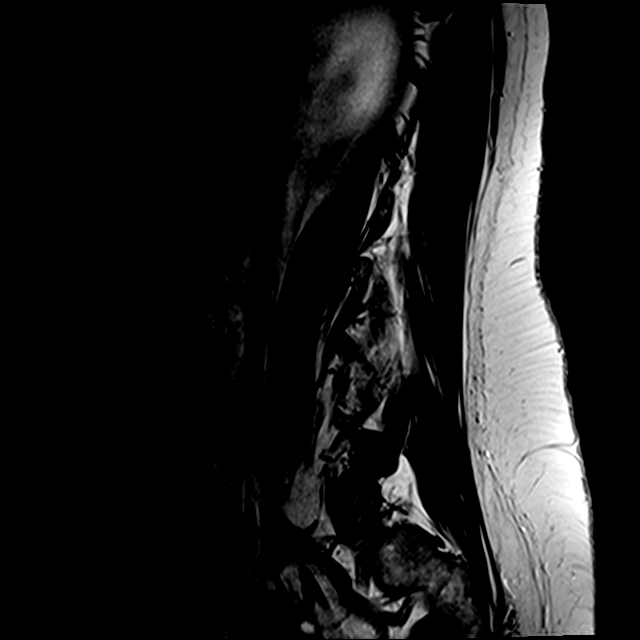

[13 of 48 positions shown; findings below may reference images not displayed]

FINDINGS: Anatomy is transitional. S1 is being described as a transitional
vertebra. Correlation with this numbering scheme would be important
should intervention be contemplated.

T12-L1:  Normal interspace.

L1-2: No disc abnormality. Facet arthropathy and hypertrophy on the
left without apparent neural compression. Conus tip at this level.

L2-3:  Normal interspace.

L3-4:  Normal interspace.

L4-5: Disc degeneration with annular fissures an annular bulging.
Slight indentation of the thecal sac. Mild facet degeneration. No
apparent compressive stenosis.

L5-S1: Previous left hemilaminectomy. Disc degeneration with
endplate osteophytes and mild bulging of the disc. Mild left-sided
epidural fibrosis. No residual or recurrent disc herniation or
evidence of ongoing neural compression. Chronic fatty changes of the
endplate marrow without evidence of active edema.

S1-2:  Transitional and normal.
IMPRESSION: S1 is being described as a transitional vertebra.

Left-sided facet arthropathy at L1-2 but without evidence of neural
compression.

Disc degeneration at L4-5 with annular fissures an annular bulging.
Slight indentation of the thecal sac but no apparent neural
compression.

Previous left hemilaminectomy at L5-S1. Chronic degeneration of the
disc with endplate osteophytes, mild bulging of the disc and chronic
endplate marrow changes. Mild left-sided epidural fibrosis. No
evidence of recurrent disc herniation or neural compression.

## 2017-03-11 ENCOUNTER — Ambulatory Visit (INDEPENDENT_AMBULATORY_CARE_PROVIDER_SITE_OTHER): Payer: BLUE CROSS/BLUE SHIELD | Admitting: Family Medicine

## 2017-03-11 ENCOUNTER — Encounter: Payer: Self-pay | Admitting: Family Medicine

## 2017-03-11 VITALS — BP 122/82 | Ht 64.0 in | Wt 225.8 lb

## 2017-03-11 DIAGNOSIS — F411 Generalized anxiety disorder: Secondary | ICD-10-CM | POA: Diagnosis not present

## 2017-03-11 DIAGNOSIS — F410 Panic disorder [episodic paroxysmal anxiety] without agoraphobia: Secondary | ICD-10-CM

## 2017-03-11 MED ORDER — ALPRAZOLAM 0.5 MG PO TABS: ORAL_TABLET | ORAL | 1 refills | Status: DC

## 2017-03-11 MED ORDER — CITALOPRAM HYDROBROMIDE 20 MG PO TABS: 20.0000 mg | ORAL_TABLET | Freq: Every day | ORAL | 3 refills | Status: DC

## 2017-03-11 NOTE — Progress Notes (Signed)
   Subjective:    Patient ID: Tiffany Townsend, female    DOB: 27-Jan-1976, 41 y.o.   MRN: [id number]  HPI   Patient arrives with c/o anxiety. Would like to restart medication. Significant panic attacks intermittently over the past few weeks also frequent anxiety and depression both anxiety scale and depression scale were filled out shows significant anxiety and depression patient denies being suicidal but at times she feels. Stressed out. She is interested in some counseling. She is open to going back on medicine because medicine help before. PMH benign Review of Systems    denies any chest tightness pressure pain shortness breath nausea vomiting diarrhea except for when she's anxious she gets all of those along with nausea Objective:   Physical Exam Lungs clear heart trigger pulse normal BP good       Assessment & Plan:  Long discussion held with patient Depression with anxiety along with panic attack I feel the primary issue is anxiety issues with mild depression not suicidal occasional panic attacks Restart Celexa recheck in 3 weeks side effects discussed start off with half tablet daily then 1 daily thereafter Xanax sparingly for panic attack only at home cautioned drowsiness Long discussion held with patient how she would benefit from counseling we will help set this up follow-up patient 3 weeks if she becomes suicidal or worse she is connect with [place] right away for recheck and discussion

## 2017-03-12 NOTE — Addendum Note (Signed)
Addended by: Carmelina Noun on: 03/12/2017 10:53 AM   Modules accepted: Orders

## 2017-03-18 ENCOUNTER — Encounter: Payer: Self-pay | Admitting: Family Medicine

## 2017-03-28 ENCOUNTER — Ambulatory Visit (INDEPENDENT_AMBULATORY_CARE_PROVIDER_SITE_OTHER): Payer: BLUE CROSS/BLUE SHIELD | Admitting: Family Medicine

## 2017-03-28 ENCOUNTER — Encounter: Payer: Self-pay | Admitting: Family Medicine

## 2017-03-28 VITALS — BP 124/80 | Ht 64.0 in | Wt 224.0 lb

## 2017-03-28 DIAGNOSIS — K582 Mixed irritable bowel syndrome: Secondary | ICD-10-CM | POA: Diagnosis not present

## 2017-03-28 DIAGNOSIS — F411 Generalized anxiety disorder: Secondary | ICD-10-CM | POA: Diagnosis not present

## 2017-03-28 DIAGNOSIS — R1084 Generalized abdominal pain: Secondary | ICD-10-CM | POA: Diagnosis not present

## 2017-03-28 MED ORDER — CITALOPRAM HYDROBROMIDE 40 MG PO TABS: 40.0000 mg | ORAL_TABLET | Freq: Every day | ORAL | 4 refills | Status: DC

## 2017-03-28 MED ORDER — DICYCLOMINE HCL 10 MG PO CAPS: ORAL_CAPSULE | ORAL | 3 refills | Status: DC

## 2017-03-28 NOTE — Patient Instructions (Signed)
Diet for Irritable Bowel Syndrome When you have irritable bowel syndrome (IBS), the foods you eat and your eating habits are very important. IBS may cause various symptoms, such as abdominal pain, constipation, or diarrhea. Choosing the right foods can help ease discomfort caused by these symptoms. Work with your health care provider and dietitian to find the best eating plan to help control your symptoms. What general guidelines do I need to follow?  Keep a food diary. This will help you identify foods that cause symptoms. Write down:  What you eat and when.  What symptoms you have.  When symptoms occur in relation to your meals.  Avoid foods that cause symptoms. Talk with your dietitian about other ways to get the same nutrients that are in these foods.  Eat more foods that contain fiber. Take a fiber supplement if directed by your dietitian.  Eat your meals slowly, in a relaxed setting.  Aim to eat 5-6 small meals per day. Do not skip meals.  Drink enough fluids to keep your urine clear or pale yellow.  Ask your health care provider if you should take an over-the-counter probiotic during flare-ups to help restore healthy gut bacteria.  If you have cramping or diarrhea, try making your meals low in fat and high in carbohydrates. Examples of carbohydrates are pasta, rice, whole grain breads and cereals, fruits, and vegetables.  If dairy products cause your symptoms to flare up, try eating less of them. You might be able to handle yogurt better than other dairy products because it contains bacteria that help with digestion. What foods are not recommended? The following are some foods and drinks that may worsen your symptoms:  Fatty foods, such as [place] fries.  Milk products, such as cheese or ice cream.  Chocolate.  Alcohol.  Products with caffeine, such as coffee.  Carbonated drinks, such as soda. The items listed above may not be a complete list of foods and beverages to  avoid. Contact your dietitian for more information.  What foods are good sources of fiber? Your health care provider or dietitian may recommend that you eat more foods that contain fiber. Fiber can help reduce constipation and other IBS symptoms. Add foods with fiber to your diet a little at a time so that your body can get used to them. Too much fiber at once might cause gas and swelling of your abdomen. The following are some foods that are good sources of fiber:  Apples.  Peaches.  Pears.  Berries.  Figs.  Broccoli (raw).  Cabbage.  Carrots.  Raw peas.  Kidney beans.  Lima beans.  Whole grain bread.  Whole grain cereal. Where to find more information: [employer] for Functional Gastrointestinal Disorders: www.iffgd.[employer] of Diabetes and Digestive and Kidney Diseases: NetworkAffair.co.za.aspx This information is not intended to replace advice given to you by your health care provider. Make sure you discuss any questions you have with your health care provider. Document Released: 01/25/2004 Document Revised: 04/11/2016 Document Reviewed: 02/04/2014 Elsevier Interactive Patient Education  2017 [employer].

## 2017-03-28 NOTE — Progress Notes (Signed)
   Subjective:    Patient ID: Tiffany Townsend, female    DOB: 1976/11/04, 41 y.o.   MRN: [id number]  Anxiety  Presents for follow-up visit.    Patient in today for 4 week follow up on Anxiety. Patient was started on Celexa.  Patient still has spells of feeling sad and blue she also has intermittent anxiety and intermittent panic attacks. Denies being suicidal or homicidal. Does feel like she would benefit from seeing a specialist/counselor States no other concerns this visit.  Stomach gets up set  Anxious some better Feeling depressed Eating fair BM 4 to 5 if stressed Typically varies from sort of hard to loose No mucous or blood no fever No food association Not much dairy Sleep fair Worries a lot  Crys often  Review of Systems Not much cramping Feels nausea Belly hurts    Objective:   Physical Exam  Constitutional: She appears well-nourished. No distress.  HENT:  Head: Normocephalic.  Cardiovascular: Normal rate, regular rhythm and normal heart sounds.   No murmur heard. Pulmonary/Chest: Effort normal and breath sounds normal.  Abdominal: Soft. She exhibits no distension.  Musculoskeletal: She exhibits no edema.  Lymphadenopathy:    She has no cervical adenopathy.  Neurological: She is alert.  Psychiatric: Her behavior is normal.  Vitals reviewed.         Assessment & Plan:  Referral to psychology Continue medications Increase Celexa to 40 mg Continue Xanax on a when necessary basis not for frequent Follow-up in 6 weeks  Dear irritable bowel try dicyclomine and also Imodium when necessary check lab work

## 2017-03-28 NOTE — Progress Notes (Signed)
Referral ordered in epic 

## 2017-03-28 NOTE — Addendum Note (Signed)
Addended by: Ofilia Neas R on: 03/28/2017 02:09 PM   Modules accepted: Orders

## 2017-03-28 NOTE — Progress Notes (Signed)
Referral ordered for psychology.  

## 2017-03-30 LAB — CBC WITH DIFFERENTIAL/PLATELET
BASOS: 0 %
Basophils Absolute: 0 10*3/uL (ref 0.0–0.2)
EOS (ABSOLUTE): 0.1 10*3/uL (ref 0.0–0.4)
Eos: 1 %
Hematocrit: 45.2 % (ref 34.0–46.6)
Hemoglobin: 15.4 g/dL (ref 11.1–15.9)
IMMATURE GRANS (ABS): 0 10*3/uL (ref 0.0–0.1)
IMMATURE GRANULOCYTES: 0 %
Lymphocytes Absolute: 1.8 10*3/uL (ref 0.7–3.1)
Lymphs: 26 %
MCH: 29.7 pg (ref 26.6–33.0)
MCHC: 34.1 g/dL (ref 31.5–35.7)
MCV: 87 fL (ref 79–97)
Monocytes Absolute: 0.3 10*3/uL (ref 0.1–0.9)
Monocytes: 4 %
Neutrophils Absolute: 4.8 10*3/uL (ref 1.4–7.0)
Neutrophils: 69 %
PLATELETS: 235 10*3/uL (ref 150–379)
RBC: 5.19 x10E6/uL (ref 3.77–5.28)
RDW: 14.3 % (ref 12.3–15.4)
WBC: 7 10*3/uL (ref 3.4–10.8)

## 2017-03-30 LAB — HEPATIC FUNCTION PANEL
ALT: 16 IU/L (ref 0–32)
AST: 12 IU/L (ref 0–40)
Albumin: 4.2 g/dL (ref 3.5–5.5)
Alkaline Phosphatase: 55 IU/L (ref 39–117)
BILIRUBIN TOTAL: 0.9 mg/dL (ref 0.0–1.2)
BILIRUBIN, DIRECT: 0.22 mg/dL (ref 0.00–0.40)
TOTAL PROTEIN: 7.2 g/dL (ref 6.0–8.5)

## 2017-03-30 LAB — LIPASE: Lipase: 37 U/L (ref 14–72)

## 2017-03-30 LAB — TISSUE TRANSGLUTAMINASE, IGA

## 2017-04-01 ENCOUNTER — Encounter: Payer: Self-pay | Admitting: Family Medicine

## 2017-04-15 ENCOUNTER — Encounter: Payer: Self-pay | Admitting: Family Medicine

## 2017-04-15 ENCOUNTER — Ambulatory Visit (INDEPENDENT_AMBULATORY_CARE_PROVIDER_SITE_OTHER): Payer: BLUE CROSS/BLUE SHIELD | Admitting: Family Medicine

## 2017-04-15 VITALS — BP 132/86 | Ht 64.0 in | Wt 223.4 lb

## 2017-04-15 DIAGNOSIS — G43001 Migraine without aura, not intractable, with status migrainosus: Secondary | ICD-10-CM

## 2017-04-15 MED ORDER — TIZANIDINE HCL 4 MG PO TABS: 4.0000 mg | ORAL_TABLET | Freq: Four times a day (QID) | ORAL | 2 refills | Status: DC | PRN

## 2017-04-15 MED ORDER — HYDROCODONE-ACETAMINOPHEN 5-325 MG PO TABS: ORAL_TABLET | ORAL | 0 refills | Status: DC

## 2017-04-15 NOTE — Progress Notes (Signed)
   Subjective:    Patient ID: Tiffany Townsend, female    DOB: [birth year], 41 y.o.   MRN: [id number]  HPI  Patient arrives with c/o migraine headache for 4 days.  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 Blood pressure good on repeat. HEENT normal. Lungs clear. Heart regular rate and rhythm.       Assessment & Plan:  Impression persistent migraine. Usual interventions not breaking this particular episode. Discussed will initiate hydrocodone for short-term use only local measures discussed

## 2017-04-24 ENCOUNTER — Telehealth (HOSPITAL_COMMUNITY): Payer: Self-pay | Admitting: *Deleted

## 2017-04-24 NOTE — Telephone Encounter (Signed)
left voice message regarding appointment. 

## 2017-05-01 ENCOUNTER — Telehealth (HOSPITAL_COMMUNITY): Payer: Self-pay | Admitting: *Deleted

## 2017-05-01 NOTE — Telephone Encounter (Signed)
left voice message regarding appointment. 

## 2017-05-08 ENCOUNTER — Ambulatory Visit: Payer: BLUE CROSS/BLUE SHIELD | Admitting: Family Medicine

## 2017-05-12 ENCOUNTER — Encounter: Payer: Self-pay | Admitting: Family Medicine

## 2017-05-12 ENCOUNTER — Ambulatory Visit (INDEPENDENT_AMBULATORY_CARE_PROVIDER_SITE_OTHER): Payer: BLUE CROSS/BLUE SHIELD | Admitting: Family Medicine

## 2017-05-12 VITALS — BP 124/86 | Temp 98.8°F | Ht 64.0 in | Wt 220.0 lb

## 2017-05-12 DIAGNOSIS — N3000 Acute cystitis without hematuria: Secondary | ICD-10-CM | POA: Diagnosis not present

## 2017-05-12 DIAGNOSIS — F411 Generalized anxiety disorder: Secondary | ICD-10-CM

## 2017-05-12 DIAGNOSIS — R109 Unspecified abdominal pain: Secondary | ICD-10-CM | POA: Diagnosis not present

## 2017-05-12 LAB — POCT URINALYSIS DIPSTICK
RBC UA: 250
pH, UA: 5 (ref 5.0–8.0)

## 2017-05-12 MED ORDER — CEFPROZIL 500 MG PO TABS: 500.0000 mg | ORAL_TABLET | Freq: Two times a day (BID) | ORAL | 0 refills | Status: DC

## 2017-05-12 NOTE — Addendum Note (Signed)
Addended by: Carmelina Noun on: 05/12/2017 12:05 PM   Modules accepted: Orders

## 2017-05-12 NOTE — Progress Notes (Signed)
   Subjective:    Patient ID: Tiffany Townsend, female    DOB: 05-25-1976, 41 y.o.   MRN: [id number]  HPI6 week follow up on anxiety.  Patient denies being depressed she does state she finds himself intermittently anxious she states she starts counseling tomorrow she is looking forward to this. She is hopeful it will help she uses Xanax sparingly   Abdominal pain. Started 2 nights ago. Some diarrhea. Took imodium and it helped some.  Patient states she is not had any abdominal pain over the past several weeks with normal bowel movements no blood over the past couple days some aching in the mid abdomen lower abdomen denies any guarding or rebound. No vomiting some nausea had some loose stools denies dysuria but does does relate urinary frequency which is normal for her because she drinks a lot of liquids    Review of Systems  Constitutional: Positive for fatigue. Negative for fever.  Respiratory: Negative for cough.   Cardiovascular: Negative for chest pain.  Gastrointestinal: Positive for abdominal pain, diarrhea and nausea. Negative for constipation and vomiting.  Genitourinary: Positive for frequency. Negative for difficulty urinating and dysuria.       Objective:   Physical Exam  Constitutional: She appears well-nourished. No distress.  HENT:  Head: Normocephalic.  Cardiovascular: Normal rate, regular rhythm and normal heart sounds.   No murmur heard. Pulmonary/Chest: Effort normal and breath sounds normal.  Musculoskeletal: She exhibits no edema.  Lymphadenopathy:    She has no cervical adenopathy.  Neurological: She is alert.  Psychiatric: Her behavior is normal.  Vitals reviewed.  Abdomen is soft there is no guarding rebound but there is tenderness in the mid abdomen in the lower midpelvis no point tenderness       Assessment & Plan:  Generalized anxiety disorder continue current medication starting psychologic counseling tomorrow. Patient will give [place] an update in the  next 4 weeks how she is doing still has some anxiety denies being depressed  Intermittent abdominal pain past couple days probable UTI antibiotics prescribed send in urine culture ordered await results if worsening symptoms will need testing

## 2017-05-13 ENCOUNTER — Encounter (HOSPITAL_COMMUNITY): Payer: Self-pay | Admitting: Licensed Clinical Social Worker

## 2017-05-13 ENCOUNTER — Telehealth: Payer: Self-pay | Admitting: Family Medicine

## 2017-05-13 ENCOUNTER — Ambulatory Visit (INDEPENDENT_AMBULATORY_CARE_PROVIDER_SITE_OTHER): Payer: BLUE CROSS/BLUE SHIELD | Admitting: Licensed Clinical Social Worker

## 2017-05-13 DIAGNOSIS — F4001 Agoraphobia with panic disorder: Secondary | ICD-10-CM | POA: Diagnosis not present

## 2017-05-13 DIAGNOSIS — F4323 Adjustment disorder with mixed anxiety and depressed mood: Secondary | ICD-10-CM

## 2017-05-13 MED ORDER — CEFPROZIL 500 MG PO TABS: 500.0000 mg | ORAL_TABLET | Freq: Two times a day (BID) | ORAL | 0 refills | Status: DC
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
## 2017-05-13 NOTE — Telephone Encounter (Signed)
Patient called requesting we resubmit prescription of cefzil that was sent into pharmacy yesterday because pharmacy did not receive it. Medication we sent into pharmacy.

## 2017-05-14 LAB — URINE CULTURE

## 2017-05-28 ENCOUNTER — Ambulatory Visit (HOSPITAL_COMMUNITY): Payer: Self-pay | Admitting: Licensed Clinical Social Worker

## 2017-09-11 ENCOUNTER — Ambulatory Visit (INDEPENDENT_AMBULATORY_CARE_PROVIDER_SITE_OTHER): Payer: BLUE CROSS/BLUE SHIELD | Admitting: Family Medicine

## 2017-09-11 ENCOUNTER — Encounter: Payer: Self-pay | Admitting: Family Medicine

## 2017-09-11 VITALS — BP 120/80 | Ht 64.0 in | Wt 220.0 lb

## 2017-09-11 DIAGNOSIS — F410 Panic disorder [episodic paroxysmal anxiety] without agoraphobia: Secondary | ICD-10-CM

## 2017-09-11 DIAGNOSIS — F320 Major depressive disorder, single episode, mild: Secondary | ICD-10-CM | POA: Diagnosis not present

## 2017-09-11 DIAGNOSIS — F411 Generalized anxiety disorder: Secondary | ICD-10-CM | POA: Diagnosis not present

## 2017-09-11 MED ORDER — SERTRALINE HCL 50 MG PO TABS: 50.0000 mg | ORAL_TABLET | Freq: Every day | ORAL | 3 refills | Status: DC

## 2017-09-11 MED ORDER — ALPRAZOLAM 0.5 MG PO TABS: ORAL_TABLET | ORAL | 3 refills | Status: DC

## 2017-09-11 NOTE — Progress Notes (Signed)
   Subjective:    Patient ID: Tiffany Townsend, female    DOB: Sep 08, 1976, 41 y.o.   MRN: [id number]  Anxiety  Presents for follow-up visit.     Depression screen [facility] 2/9 09/11/2017 03/11/2017  Decreased Interest 2 2  Down, Depressed, Hopeless 3 2  PHQ - 2 Score 5 4  Altered sleeping 3 2  Tired, decreased energy 3 2  Change in appetite 3 2  Feeling bad or failure about yourself  3 2  Trouble concentrating 0 1  Moving slowly or fidgety/restless 0 1  Suicidal thoughts 0 0  PHQ-9 Score 17 14  Difficult doing work/chores Very difficult Very difficult   generalized anxiety disorder GAD 7 : Generalized Anxiety Score 03/11/2017  Nervous, Anxious, on Edge 2  Control/stop worrying 3  Worry too much - different things 3  Trouble relaxing 1  Restless 1  Easily annoyed or irritable 3  Afraid - awful might happen 3  Total GAD 7 Score 16  Anxiety Difficulty Very difficult   GAD 7 : Generalized Anxiety Score 09/11/2017 03/11/2017  Nervous, Anxious, on Edge 3 2  Control/stop worrying 3 3  Worry too much - different things 3 3  Trouble relaxing 3 1  Restless 1 1  Easily annoyed or irritable 3 3  Afraid - awful might happen 3 3  Total GAD 7 Score 19 16  Anxiety Difficulty Very difficult Very difficult      Patient states no other concerns this visit.  1520 minutes spent with patient discussing her symptoms she is having a lot of anxiousness and tearfulness crying spells at times feeling very upset she denies being suicidal.  She states a lot has been going on and she feels stressed out a lot she was going to counseling was taken medicine but she stopped taking medicine she feels now that she is ready to take medicine again and start counseling  Review of Systems     Objective:   Physical Exam  15 minutes spent with patient greater than half was spent in consultation answering multiple questions Sooner if any problems     Assessment & Plan:  Generalized anxiety disorder Panic  attacks Major depression Referral for counseling patient is willing to go back to counseling  Zoloft 50 mg follow-up in 4 weeks at that time more than likely will move to 100 mg  Xanax refills given for sparing use caution drowsiness

## 2017-10-14 ENCOUNTER — Ambulatory Visit: Payer: BLUE CROSS/BLUE SHIELD | Admitting: Family Medicine

## 2017-11-26 ENCOUNTER — Ambulatory Visit: Payer: BLUE CROSS/BLUE SHIELD | Admitting: Family Medicine

## 2017-12-05 ENCOUNTER — Ambulatory Visit: Payer: BLUE CROSS/BLUE SHIELD | Admitting: Family Medicine

## 2018-01-27 ENCOUNTER — Ambulatory Visit: Payer: Managed Care, Other (non HMO) | Admitting: Family Medicine

## 2018-01-27 ENCOUNTER — Encounter: Payer: Self-pay | Admitting: Family Medicine

## 2018-01-27 VITALS — BP 132/80 | Temp 99.0°F | Ht 64.0 in | Wt 220.0 lb

## 2018-01-27 DIAGNOSIS — H6012 Cellulitis of left external ear: Secondary | ICD-10-CM | POA: Diagnosis not present

## 2018-01-27 MED ORDER — DOXYCYCLINE HYCLATE 100 MG PO TABS: 100.0000 mg | ORAL_TABLET | Freq: Two times a day (BID) | ORAL | 0 refills | Status: DC

## 2018-01-27 NOTE — Progress Notes (Signed)
   Subjective:    Patient ID: Tiffany Townsend, female    DOB: 1976/01/25, 42 y.o.   MRN: [id number]  HPIleft ear pain, redness and swelling. after getting ear piercing. Tried advil, sea salt spray, dial soap.  Had an ear piercing.  Developed subsequent pain.  Now warm to touch.  Aching pain at times sharp radiating into the cheek.  Also below the jaw.  No fever no chills no vomiting somewhat diminished appetite    Review of Systems See above    Objective:   Physical Exam Left external ear inflamedAlert vitals stable, NAD. Blood pressure good on repeat. HEENT normal. Lungs clear. Heart regular rate and rhythm. Tender warm to touch anteriorly.  At site of the ear piercing.       Assessment & Plan:  Impression cellulitis post ear piercing warning signs discussed local measures discussed doxycycline 100 twice daily 10 days symptom

## 2018-05-20 ENCOUNTER — Encounter: Payer: Self-pay | Admitting: Family Medicine

## 2018-05-20 ENCOUNTER — Ambulatory Visit: Payer: Managed Care, Other (non HMO) | Admitting: Family Medicine

## 2018-05-20 VITALS — BP 132/80 | Ht 64.0 in | Wt 210.0 lb

## 2018-05-20 DIAGNOSIS — F410 Panic disorder [episodic paroxysmal anxiety] without agoraphobia: Secondary | ICD-10-CM | POA: Diagnosis not present

## 2018-05-20 DIAGNOSIS — F320 Major depressive disorder, single episode, mild: Secondary | ICD-10-CM | POA: Diagnosis not present

## 2018-05-20 DIAGNOSIS — R109 Unspecified abdominal pain: Secondary | ICD-10-CM

## 2018-05-20 MED ORDER — CLONAZEPAM 0.5 MG PO TABS: 0.5000 mg | ORAL_TABLET | Freq: Two times a day (BID) | ORAL | 1 refills | Status: DC | PRN

## 2018-05-20 MED ORDER — PANTOPRAZOLE SODIUM 40 MG PO TBEC: 40.0000 mg | DELAYED_RELEASE_TABLET | Freq: Every day | ORAL | 4 refills | Status: DC

## 2018-05-20 MED ORDER — FLUOXETINE HCL 20 MG PO TABS: 20.0000 mg | ORAL_TABLET | Freq: Every day | ORAL | 3 refills | Status: DC

## 2018-05-20 MED ORDER — HYOSCYAMINE SULFATE SL 0.125 MG SL SUBL: SUBLINGUAL_TABLET | SUBLINGUAL | 4 refills | Status: DC

## 2018-05-20 NOTE — Progress Notes (Signed)
   Subjective:    Patient ID: Tiffany Townsend, female    DOB: 28-Feb-1976, 42 y.o.   MRN: [id number]  HPI  Patient arrives with abdominal pain for a month.  Intermittent abdominal pain over the past month generalized in the mid abdomen epigastric region does not radiate into the back does not radiate into the lower abdomen no nausea or vomiting but does not have much of an appetite pain happens every day throughout the whole day sometimes worse than others patient also relates no bloody stools no weight loss no fever chills sweats denies having this before denies any triggers relates she is under a lot of stress finds herself feeling anxious nervous depressed at times and panic attacks no longer taking antidepressants because they caused her to have nausea  Review of Systems  Constitutional: Negative for activity change, appetite change and fatigue.  HENT: Negative for congestion and rhinorrhea.   Respiratory: Negative for cough and shortness of breath.   Cardiovascular: Negative for chest pain and leg swelling.  Gastrointestinal: Positive for abdominal pain and nausea. Negative for diarrhea.  Endocrine: Negative for polydipsia and polyphagia.  Skin: Negative for color change.  Neurological: Negative for weakness and headaches.  Psychiatric/Behavioral: Negative for agitation and confusion.       Objective:   Physical Exam  Constitutional: She appears well-nourished. No distress.  Cardiovascular: Normal rate, regular rhythm and normal heart sounds.  No murmur heard. Pulmonary/Chest: Effort normal and breath sounds normal. No respiratory distress.  Abdominal: Bowel sounds are normal. There is no hepatosplenomegaly. There is tenderness in the epigastric area, periumbilical area and left upper quadrant. There is no rebound and no CVA tenderness.  Musculoskeletal: She exhibits no edema.  Lymphadenopathy:    She has no cervical adenopathy.  Neurological: She is alert. She exhibits normal muscle  tone.  Psychiatric: Her behavior is normal.  Vitals reviewed.  Severe abdominal pain Probable IBS       Assessment & Plan:  Severe abdominal pain Probable IBS PPI to help take care of any extra acid Levsin to help with any IBS Probable depression with panic attacks and severe anxiety Start Prozac on a regular basis PHQ 9 and GAD reviewed Follow-up 3 to 4 weeks follow-up sooner patient not suicidal

## 2018-05-21 LAB — LIPASE: LIPASE: 33 U/L (ref 14–72)

## 2018-05-21 LAB — HEPATIC FUNCTION PANEL
ALT: 22 IU/L (ref 0–32)
AST: 16 IU/L (ref 0–40)
Albumin: 4.5 g/dL (ref 3.5–5.5)
Alkaline Phosphatase: 64 IU/L (ref 39–117)
BILIRUBIN, DIRECT: 0.24 mg/dL (ref 0.00–0.40)
Bilirubin Total: 1 mg/dL (ref 0.0–1.2)
Total Protein: 7.1 g/dL (ref 6.0–8.5)

## 2018-05-21 LAB — CBC WITH DIFFERENTIAL/PLATELET
BASOS: 1 %
Basophils Absolute: 0 10*3/uL (ref 0.0–0.2)
EOS (ABSOLUTE): 0.1 10*3/uL (ref 0.0–0.4)
Eos: 2 %
Hematocrit: 46.8 % — ABNORMAL HIGH (ref 34.0–46.6)
Hemoglobin: 15.6 g/dL (ref 11.1–15.9)
IMMATURE GRANS (ABS): 0 10*3/uL (ref 0.0–0.1)
Immature Granulocytes: 0 %
LYMPHS ABS: 2.5 10*3/uL (ref 0.7–3.1)
LYMPHS: 36 %
MCH: 29.9 pg (ref 26.6–33.0)
MCHC: 33.3 g/dL (ref 31.5–35.7)
MCV: 90 fL (ref 79–97)
MONOS ABS: 0.4 10*3/uL (ref 0.1–0.9)
Monocytes: 5 %
NEUTROS ABS: 3.9 10*3/uL (ref 1.4–7.0)
Neutrophils: 56 %
PLATELETS: 269 10*3/uL (ref 150–450)
RBC: 5.22 x10E6/uL (ref 3.77–5.28)
RDW: 13.2 % (ref 12.3–15.4)
WBC: 6.9 10*3/uL (ref 3.4–10.8)

## 2018-05-21 LAB — BASIC METABOLIC PANEL
BUN/Creatinine Ratio: 15 (ref 9–23)
BUN: 13 mg/dL (ref 6–24)
CALCIUM: 9.5 mg/dL (ref 8.7–10.2)
CO2: 22 mmol/L (ref 20–29)
CREATININE: 0.88 mg/dL (ref 0.57–1.00)
Chloride: 103 mmol/L (ref 96–106)
GFR, EST AFRICAN AMERICAN: 94 mL/min/{1.73_m2} (ref >59)
GFR, EST NON AFRICAN AMERICAN: 82 mL/min/{1.73_m2} (ref >59)
Glucose: 104 mg/dL — ABNORMAL HIGH (ref 65–99)
POTASSIUM: 4.4 mmol/L (ref 3.5–5.2)
Sodium: 138 mmol/L (ref 134–144)

## 2018-06-12 ENCOUNTER — Encounter: Payer: Self-pay | Admitting: Family Medicine

## 2018-06-12 ENCOUNTER — Ambulatory Visit: Payer: Managed Care, Other (non HMO) | Admitting: Family Medicine

## 2018-06-12 VITALS — BP 122/90 | Ht 64.0 in | Wt 216.0 lb

## 2018-06-12 DIAGNOSIS — R109 Unspecified abdominal pain: Secondary | ICD-10-CM | POA: Diagnosis not present

## 2018-06-12 DIAGNOSIS — F320 Major depressive disorder, single episode, mild: Secondary | ICD-10-CM | POA: Diagnosis not present

## 2018-06-12 DIAGNOSIS — R0789 Other chest pain: Secondary | ICD-10-CM | POA: Diagnosis not present

## 2018-06-12 DIAGNOSIS — K219 Gastro-esophageal reflux disease without esophagitis: Secondary | ICD-10-CM

## 2018-06-12 MED ORDER — SUCRALFATE 1 G PO TABS: ORAL_TABLET | ORAL | 1 refills | Status: DC

## 2018-06-12 MED ORDER — DICYCLOMINE HCL 10 MG PO CAPS: ORAL_CAPSULE | ORAL | 2 refills | Status: DC

## 2018-06-12 NOTE — Progress Notes (Signed)
   Subjective:    Patient ID: Tiffany Townsend, female    DOB: Sep 07, 1976, 42 y.o.   MRN: [id number]  HPI  Patient is here today for a follow on Stomach and chest pain. She states they are not any better. She is having significant epigastric pain radiates into her chest chest discomfort burning discomfort describes abdominal pain describes nausea denies vomiting denies sweats chills fevers states overall energy level doing fair finds herself feeling depressed anxious nervous sometimes depressed feels like she would benefit from seeing some benefit from this.  See previous notes. Review of Systems  Constitutional: Negative for activity change, fatigue and fever.  HENT: Negative for congestion and rhinorrhea.   Respiratory: Negative for cough, chest tightness and shortness of breath.   Cardiovascular: Positive for chest pain. Negative for leg swelling.  Gastrointestinal: Positive for abdominal pain. Negative for diarrhea and nausea.  Skin: Negative for color change.  Neurological: Negative for dizziness and headaches.  Psychiatric/Behavioral: Negative for agitation and behavioral problems.  EKG was done today Compared to previous there is no acute changes No other intervention     Objective:   Physical Exam  Constitutional: She appears well-nourished. No distress.  HENT:  Head: Normocephalic and atraumatic.  Eyes: Right eye exhibits no discharge. Left eye exhibits no discharge.  Neck: No tracheal deviation present.  Cardiovascular: Normal rate, regular rhythm and normal heart sounds.  No murmur heard. Pulmonary/Chest: Effort normal and breath sounds normal. No respiratory distress.  Musculoskeletal: She exhibits no edema.  Lymphadenopathy:    She has no cervical adenopathy.  Neurological: She is alert. Coordination normal.  Skin: Skin is warm and dry.  Psychiatric: She has a normal mood and affect. Her behavior is normal.  Vitals reviewed.         Assessment & Plan:  Other  chest pain-probably GI related as well as psychologic stress I recommend very important for the patient to do her best she can at keeping and stress under control continuing medication  Referral for counseling I believe this would benefit her-has depression anxiety and panic attacks stable but still giving her significant trouble not suicidal  Referral for GI work-up will probably need EGD Continue PPI add Carafate try until 4 IBS follow-up sooner if problems otherwise recommend that we follow-up in 8 weeks sooner if any problems

## 2018-06-16 ENCOUNTER — Encounter: Payer: Self-pay | Admitting: Gastroenterology

## 2018-06-17 ENCOUNTER — Encounter: Payer: Self-pay | Admitting: Family Medicine

## 2018-07-08 ENCOUNTER — Encounter: Payer: Self-pay | Admitting: Gastroenterology

## 2018-07-08 ENCOUNTER — Ambulatory Visit (INDEPENDENT_AMBULATORY_CARE_PROVIDER_SITE_OTHER): Payer: Managed Care, Other (non HMO) | Admitting: Gastroenterology

## 2018-07-08 ENCOUNTER — Encounter (INDEPENDENT_AMBULATORY_CARE_PROVIDER_SITE_OTHER): Payer: Self-pay

## 2018-07-08 VITALS — BP 112/66 | HR 97 | Ht 64.0 in | Wt 217.0 lb

## 2018-07-08 DIAGNOSIS — R1030 Lower abdominal pain, unspecified: Secondary | ICD-10-CM | POA: Diagnosis not present

## 2018-07-08 DIAGNOSIS — R194 Change in bowel habit: Secondary | ICD-10-CM | POA: Diagnosis not present

## 2018-07-08 MED ORDER — DICYCLOMINE HCL 20 MG PO TABS: 20.0000 mg | ORAL_TABLET | Freq: Three times a day (TID) | ORAL | 3 refills | Status: DC

## 2018-07-08 MED ORDER — NA SULFATE-K SULFATE-MG SULF 17.5-3.13-1.6 GM/177ML PO SOLN: 1.0000 | ORAL | 0 refills | Status: AC

## 2018-07-08 NOTE — Progress Notes (Signed)
07/08/2018 Tiffany Townsend [id number] Mar 16, 1976   HISTORY OF PRESENT ILLNESS:  This is a 42 year old female who is new to our office.  She presents here today at the request of her PCP, Dr. Wolfgang Phoenix, with complaints of change in bowel habits with alternating constipation and diarrhea/loose stools and lower abdominal pain.  She has a lot of issues with anxiety and says that she has had stomach issues since she was in her teens.  Has been told that she likely has IBS or a "nervous stomach".  She does not really have any upper abdominal pain and no reflux type symptoms from what she tells me, but was given daily PPI and carafate tablet three times daily by her PCP.  She does report a lot of belching when she is really anxious.  Was also given dicyclomine 10 mg 3 times daily and feels that it has helped somewhat.  Celiac labs negative previously and basic labs unremarkable.  She feels like her bowel habits changed after having her gallbladder removed in 2015.  Says that she has seen small amounts of bright red blood with BM's on occasion but nothing that prompted her to worry or seek evaluation.  She says that oftentimes the pain is worse after a bowel movement.  At its worst the pain is a 6-7 out of 10 on the pain scale.  Pain is constant on a daily basis and typically runs at about a 3 or 4 out of 10.  Denies nausea or vomiting.  No fevers.   Past Medical History:  Diagnosis Date  . Anxiety   . Headache(784.0)    history migranes ocassionally  . Sciatica of right side 11/22/2015   Past Surgical History:  Procedure Laterality Date  . BACK SURGERY  04/2011   disc surgery  . CHOLECYSTECTOMY N/A 03/02/2014   Procedure: LAPAROSCOPIC CHOLECYSTECTOMY;  Surgeon: [name], MD;  Location: [facility];  Service: General;  Laterality: N/A;  . CYSTO     with dilation  . URETHRAL DILATION N/A as child    reports that she quit smoking about 20 years ago. Her smoking use included cigarettes. She has a 0.75  pack-year smoking history. She has never used smokeless tobacco. She reports that she does not drink alcohol or use drugs. family history includes Diabetes in her mother; Heart disease in her father and mother. Allergies  Allergen Reactions  . Gabapentin     migraine  . Codeine Itching      Outpatient Encounter Medications as of 07/08/2018  Medication Sig  . clonazePAM (KLONOPIN) 0.5 MG tablet Take 1 tablet (0.5 mg total) by mouth 2 (two) times daily as needed for anxiety.  . dicyclomine (BENTYL) 10 MG capsule One po three times daily for abd pain  . FLUoxetine (PROZAC) 20 MG tablet Take 1 tablet (20 mg total) by mouth daily.  . pantoprazole (PROTONIX) 40 MG tablet Take 1 tablet (40 mg total) by mouth daily.  . sucralfate (CARAFATE) 1 g tablet One 3 times per day  . tiZANidine (ZANAFLEX) 4 MG tablet Take 1 tablet (4 mg total) by mouth every 6 (six) hours as needed for muscle spasms.   No facility-administered encounter medications on file as of 07/08/2018.      REVIEW OF SYSTEMS  : All other systems reviewed and negative except where noted in the History of Present Illness.   PHYSICAL EXAM: Pulse 97   Ht 5\' 4"  (1.626 m)   Wt 217 lb (98.4  kg)   SpO2 98%   BMI 37.25 kg/m  General: Well developed white female in no acute distress Head: Normocephalic and atraumatic Eyes:  Sclerae anicteric, conjunctiva pink. Ears: Normal auditory acuity Lungs: Clear throughout to auscultation; no increased WOB. Heart: Regular rate and rhythm; no M/R/G. Abdomen: Soft, non-distended.  BS present.  Mild diffuse TTP. Rectal:  Will be done at the time of colonoscopy. Musculoskeletal: Symmetrical with no gross deformities  Skin: No lesions on visible extremities Extremities: No edema  Neurological: Alert oriented x 4, grossly non-focal Psychological:  Alert and cooperative. Normal mood and affect  ASSESSMENT AND PLAN: *42 year old female with complaints of change in bowel habits with alternating  constipation and diarrhea/loose stools and lower abdominal pain.  She has a lot of issues with anxiety and says that she has had stomach issues since she was in her teens.  I suspect that this is likely a diagnosis of irritable bowel syndrome.  She does not really have any upper abdominal pain and no reflux type symptoms.  Feels that dicyclomine 10 mg 3 times daily has helped somewhat.  Celiac labs negative previously and basic labs unremarkable.  We will schedule for colonoscopy to further evaluate and rule out IBD, etc.  I have asked her to begin taking a daily powdered fiber supplement such as Benefiber or Citrucel.  We will increase her Bentyl to 20 mg 3 times daily.  Pending results of colonoscopy and response to medications could consider CT scan, but with no exact pinpoint tenderness and more just diffuse soreness on exam we will hold off for now.  **The risks, benefits, and alternatives to colonoscopy were discussed with the patient and she consents to proceed.    CC:  Kathyrn Drown, MD

## 2018-07-08 NOTE — Patient Instructions (Addendum)
Use Benefiber or Citrucel 2 tablespoons daily in 8 oz of water.    We have sent the following medications to your pharmacy for you to pick up at your convenience: Bentyl 20 mg three times a day   You have been scheduled for a colonoscopy. Please follow written instructions given to you at your visit today.  Please pick up your prep supplies at the pharmacy within the next 1-3 days. If you use inhalers (even only as needed), please bring them with you on the day of your procedure. Your physician has requested that you go to www.startemmi.com and enter the access code given to you at your visit today. This web site gives a general overview about your procedure. However, you should still follow specific instructions given to you by our office regarding your preparation for the procedure.

## 2018-07-08 NOTE — Progress Notes (Signed)
Agree with assessment and plan as outlined.  

## 2018-07-22 ENCOUNTER — Ambulatory Visit (HOSPITAL_COMMUNITY): Payer: Self-pay | Admitting: Licensed Clinical Social Worker

## 2018-07-23 ENCOUNTER — Encounter: Payer: Self-pay | Admitting: Family Medicine

## 2018-07-23 ENCOUNTER — Ambulatory Visit: Payer: Managed Care, Other (non HMO) | Admitting: Family Medicine

## 2018-07-23 VITALS — BP 112/82 | Ht 64.0 in | Wt 215.6 lb

## 2018-07-23 DIAGNOSIS — F411 Generalized anxiety disorder: Secondary | ICD-10-CM | POA: Diagnosis not present

## 2018-07-23 DIAGNOSIS — K588 Other irritable bowel syndrome: Secondary | ICD-10-CM

## 2018-07-23 MED ORDER — FLUOXETINE HCL 20 MG PO TABS: ORAL_TABLET | ORAL | 3 refills | Status: DC

## 2018-07-23 NOTE — Progress Notes (Signed)
   Subjective:    Patient ID: Tiffany Townsend, female    DOB: 09-Dec-1975, 42 y.o.   MRN: [id number]  Abdominal Pain  Chronicity: follow up. The problem has been unchanged. The pain is located in the epigastric region. Pertinent negatives include no headaches. Treatments tried: has seen GI doctor.  Significant intermittent abdominal pain also some significant intermittent chest pain not cardiac by the description does not get chest pain with activity it occurs more at rest and sometimes at night she denies regurgitation she denies dysphagia denies rectal bleeding or hematemesis.  Patient still under a lot of stress finds herself feeling anxious worried having returning thoughts about various things not suicidal    Review of Systems  Constitutional: Negative for activity change, appetite change and fatigue.  HENT: Negative for congestion.   Respiratory: Negative for cough.   Cardiovascular: Negative for chest pain.  Gastrointestinal: Positive for abdominal pain.  Skin: Negative for color change.  Neurological: Negative for headaches.  Psychiatric/Behavioral: Negative for behavioral problems.       Objective:   Physical Exam  Constitutional: She appears well-nourished. No distress.  HENT:  Head: Normocephalic.  Cardiovascular: Normal rate, regular rhythm and normal heart sounds.  No murmur heard. Pulmonary/Chest: Effort normal and breath sounds normal.  Abdominal: Soft. Normal appearance. There is no splenomegaly or hepatomegaly.  Musculoskeletal: She exhibits no edema.  Lymphadenopathy:    She has no cervical adenopathy.  Neurological: She is alert.  Psychiatric: Her behavior is normal.  Vitals reviewed.         Assessment & Plan:  With depression with anxiety along with difficult time releasing situations would benefit from ongoing counseling current counselor she is with is not clicking well with her she would like to be seen by a different counselor she is willing to go  to [facility]  She will continue the antidepressant she states it is helping her She uses a nerve medication but I have encouraged her to avoid excessive or frequent use  Reflux medicine seems to be keeping things under control if she has ongoing trouble she may need to consider EGD with her gastroenterologist They will be doing a colonoscopy and may have a preventable diagnosis of IBS  Patient to follow-up in 3 months

## 2018-07-28 ENCOUNTER — Telehealth: Payer: Self-pay | Admitting: Family Medicine

## 2018-07-28 NOTE — Telephone Encounter (Signed)
Dr. Nicki Reaper wanted me to give pt # to [facility]  Tried to call - no answer & no voicemail set up-unable to leave message  Mailed letter to pt giving # needed

## 2018-07-30 ENCOUNTER — Other Ambulatory Visit: Payer: Self-pay | Admitting: Family Medicine

## 2018-07-31 NOTE — Telephone Encounter (Signed)
May have this +1 additional refill 

## 2018-08-03 ENCOUNTER — Ambulatory Visit (HOSPITAL_COMMUNITY): Payer: Self-pay | Admitting: Licensed Clinical Social Worker

## 2018-08-21 ENCOUNTER — Encounter: Payer: Self-pay | Admitting: Gastroenterology

## 2018-09-03 ENCOUNTER — Ambulatory Visit (AMBULATORY_SURGERY_CENTER): Payer: Managed Care, Other (non HMO) | Admitting: Gastroenterology

## 2018-09-03 ENCOUNTER — Encounter: Payer: Self-pay | Admitting: Gastroenterology

## 2018-09-03 VITALS — BP 114/82 | HR 72 | Temp 97.8°F | Resp 17 | Ht 64.0 in | Wt 217.0 lb

## 2018-09-03 DIAGNOSIS — R194 Change in bowel habit: Secondary | ICD-10-CM | POA: Diagnosis not present

## 2018-09-03 DIAGNOSIS — D123 Benign neoplasm of transverse colon: Secondary | ICD-10-CM

## 2018-09-03 DIAGNOSIS — K625 Hemorrhage of anus and rectum: Secondary | ICD-10-CM | POA: Diagnosis not present

## 2018-09-03 MED ORDER — SODIUM CHLORIDE 0.9 % IV SOLN: 500.0000 mL | Freq: Once | INTRAVENOUS | Status: DC

## 2018-09-03 NOTE — Progress Notes (Signed)
Report to PACU, RN, vss, BBS= Clear.  

## 2018-09-03 NOTE — Patient Instructions (Signed)
Handout given on polyps  YOU HAD AN ENDOSCOPIC PROCEDURE TODAY: Refer to the procedure report and other information in the discharge instructions given to you for any specific questions about what was found during the examination. If this information does not answer your questions, please call [facility] office at [phone] to clarify.   YOU SHOULD EXPECT: Some feelings of bloating in the abdomen. Passage of more gas than usual. Walking can help get rid of the air that was put into your GI tract during the procedure and reduce the bloating. If you had a lower endoscopy (such as a colonoscopy or flexible sigmoidoscopy) you may notice spotting of blood in your stool or on the toilet paper. Some abdominal soreness may be present for a day or two, also.  DIET: Your first meal following the procedure should be a light meal and then it is ok to progress to your normal diet. A half-sandwich or bowl of soup is an example of a good first meal. Heavy or fried foods are harder to digest and may make you feel nauseous or bloated. Drink plenty of fluids but you should avoid alcoholic beverages for 24 hours. If you had a esophageal dilation, please see attached instructions for diet.    ACTIVITY: Your care partner should take you home directly after the procedure. You should plan to take it easy, moving slowly for the rest of the day. You can resume normal activity the day after the procedure however YOU SHOULD NOT DRIVE, use power tools, machinery or perform tasks that involve climbing or major physical exertion for 24 hours (because of the sedation medicines used during the test).   SYMPTOMS TO REPORT IMMEDIATELY: A gastroenterologist can be reached at any hour. Please call [phone]  for any of the following symptoms:  Following lower endoscopy (colonoscopy, flexible sigmoidoscopy) Excessive amounts of blood in the stool  Significant tenderness, worsening of abdominal pains  Swelling of the abdomen that is  new, acute  Fever of 100 or higher    FOLLOW UP:  If any biopsies were taken you will be contacted by phone or by letter within the next 1-3 weeks. Call [phone]  if you have not heard about the biopsies in 3 weeks.  Please also call with any specific questions about appointments or follow up tests.  

## 2018-09-03 NOTE — Progress Notes (Signed)
Called to room to assist during endoscopic procedure.  Patient ID and intended procedure confirmed with present staff. Received instructions for my participation in the procedure from the performing physician.  

## 2018-09-03 NOTE — Op Note (Signed)
[facility] Patient Name: Tiffany Townsend Procedure Date: 09/03/2018 2:25 PM MRN: [id number] Endoscopist: Remo Lipps P. Havery Moros , MD Age: 42 Referring MD:  Date of Birth: April 22, 1976 Gender: Female Account #: [id number] Procedure:                Colonoscopy Indications:              Hematochezia, Change in bowel habits Medicines:                Monitored Anesthesia Care Procedure:                Pre-Anesthesia Assessment:                           - Prior to the procedure, a History and Physical                            was performed, and patient medications and                            allergies were reviewed. The patient's tolerance of                            previous anesthesia was also reviewed. The risks                            and benefits of the procedure and the sedation                            options and risks were discussed with the patient.                            All questions were answered, and informed consent                            was obtained. Prior Anticoagulants: The patient has                            taken no previous anticoagulant or antiplatelet                            agents. ASA Grade Assessment: II - A patient with                            mild systemic disease. After reviewing the risks                            and benefits, the patient was deemed in                            satisfactory condition to undergo the procedure.                           After obtaining informed consent, the colonoscope  was passed under direct vision. Throughout the                            procedure, the patient's blood pressure, pulse, and                            oxygen saturations were monitored continuously. The                            Colonoscope was introduced through the anus and                            advanced to the the terminal ileum, with                            identification of the  appendiceal orifice and IC                            valve. The colonoscopy was performed without                            difficulty. The patient tolerated the procedure                            well. The quality of the bowel preparation was                            fair. The terminal ileum, ileocecal valve,                            appendiceal orifice, and rectum were photographed. Scope In: 2:29:47 PM Scope Out: 2:50:54 PM Scope Withdrawal Time: 0 hours 15 minutes 2 seconds  Total Procedure Duration: 0 hours 21 minutes 7 seconds  Findings:                 The perianal and digital rectal examinations were                            normal.                           The terminal ileum appeared normal.                           Two sessile polyps were found in the transverse                            colon. The polyps were 3 to 4 mm in size. These                            polyps were removed with a cold snare. Resection                            and retrieval were complete.  A moderate amount of semi-liquid stool was found in                            the entire colon, making visualization difficult,                            worst in the right colon. Lavage of the area was                            performed using copious amounts of sterile water,                            resulting in incomplete clearance with fair                            visualization.                           Small internal hemorrhoids were found. The                            hemorrhoids were small.                           The exam was otherwise without abnormality. Rectal                            vault was small, retroflexed views not obtained.                           Biopsies for histology were taken with a cold                            forceps from the right colon, left colon and                            transverse colon for evaluation of microscopic                             colitis. Complications:            No immediate complications. Estimated blood loss:                            Minimal. Estimated Blood Loss:     Estimated blood loss was minimal. Impression:               - Preparation of the colon was fair, small or flat                            polyps may not have been appreciated given the                            bowel prep                           -  The examined portion of the ileum was normal.                           - Two 3 to 4 mm polyps in the transverse colon,                            removed with a cold snare. Resected and retrieved.                           - Small internal hemorrhoids.                           - The examination was otherwise normal.                           - Retroflexed views not obtained due to small                            rectal vault.                           - Biopsies were taken with a cold forceps from the                            right colon, left colon and transverse colon for                            evaluation of microscopic colitis. Recommendation:           - Patient has a contact number available for                            emergencies. The signs and symptoms of potential                            delayed complications were discussed with the                            patient. Return to normal activities tomorrow.                            Written discharge instructions were provided to the                            patient.                           - Resume previous diet.                           - Continue present medications.                           - Trial of daily fiber supplement (such as  Citrucel, Benefiber, etc)                           - Await pathology results. Remo Lipps P. Peri Kreft, MD 09/03/2018 2:58:00 PM This report has been signed electronically.

## 2018-09-04 ENCOUNTER — Telehealth: Payer: Self-pay | Admitting: *Deleted

## 2018-09-04 ENCOUNTER — Telehealth: Payer: Self-pay

## 2018-09-04 NOTE — Telephone Encounter (Signed)
First follow up call attempt.  Unable to leave message on voicemail.

## 2018-09-04 NOTE — Telephone Encounter (Signed)
  Follow up Call-  Call back number 09/03/2018  Post procedure Call Back phone  # [phone]  Permission to leave phone message Yes  Some recent data might be hidden     Patient questions:  Do you have a fever, pain , or abdominal swelling? No. Pain Score  0 *  Have you tolerated food without any problems? Yes.    Have you been able to return to your normal activities? Yes.    Do you have any questions about your discharge instructions: Diet   No. Medications  No. Follow up visit  No.  Do you have questions or concerns about your Care? No.  Actions: * If pain score is 4 or above: No action needed, pain <4.   Pt questioned why she was still having loose bowel movements.  Advised that that is normal.  Also advised to follow instructions regarding adding Citrucel or Benefiber as a daily supplement.  Pt. Agreed.

## 2018-09-09 ENCOUNTER — Encounter: Payer: Self-pay | Admitting: Gastroenterology

## 2018-10-06 ENCOUNTER — Other Ambulatory Visit: Payer: Self-pay | Admitting: Family Medicine

## 2018-10-08 NOTE — Telephone Encounter (Signed)
May have this +3 refills 

## 2018-10-22 ENCOUNTER — Ambulatory Visit: Payer: Managed Care, Other (non HMO) | Admitting: Family Medicine

## 2018-11-22 ENCOUNTER — Encounter (HOSPITAL_COMMUNITY): Payer: Self-pay | Admitting: *Deleted

## 2018-11-22 ENCOUNTER — Other Ambulatory Visit: Payer: Self-pay

## 2018-11-22 ENCOUNTER — Emergency Department (HOSPITAL_COMMUNITY)
Admission: EM | Admit: 2018-11-22 | Discharge: 2018-11-22 | Disposition: A | Discharge status: Home / Self Care | Payer: Managed Care, Other (non HMO) | Attending: Emergency Medicine | Admitting: Emergency Medicine

## 2018-11-22 ENCOUNTER — Emergency Department (HOSPITAL_COMMUNITY): Payer: Managed Care, Other (non HMO)

## 2018-11-22 DIAGNOSIS — K219 Gastro-esophageal reflux disease without esophagitis: Secondary | ICD-10-CM | POA: Diagnosis not present

## 2018-11-22 DIAGNOSIS — R079 Chest pain, unspecified: Secondary | ICD-10-CM | POA: Diagnosis present

## 2018-11-22 DIAGNOSIS — R51 Headache: Secondary | ICD-10-CM | POA: Diagnosis not present

## 2018-11-22 DIAGNOSIS — Z87891 Personal history of nicotine dependence: Secondary | ICD-10-CM | POA: Insufficient documentation

## 2018-11-22 DIAGNOSIS — R519 Headache, unspecified: Secondary | ICD-10-CM

## 2018-11-22 DIAGNOSIS — M940 Chondrocostal junction syndrome [Tietze]: Secondary | ICD-10-CM | POA: Insufficient documentation

## 2018-11-22 DIAGNOSIS — Z79899 Other long term (current) drug therapy: Secondary | ICD-10-CM | POA: Insufficient documentation

## 2018-11-22 LAB — CBC
HCT: 45.5 % (ref 36.0–46.0)
Hemoglobin: 15 g/dL (ref 12.0–15.0)
MCH: 29 pg (ref 26.0–34.0)
MCHC: 33 g/dL (ref 30.0–36.0)
MCV: 88 fL (ref 80.0–100.0)
NRBC: 0 % (ref 0.0–0.2)
Platelets: 269 10*3/uL (ref 150–400)
RBC: 5.17 MIL/uL — AB (ref 3.87–5.11)
RDW: 13.4 % (ref 11.5–15.5)
WBC: 8.2 10*3/uL (ref 4.0–10.5)

## 2018-11-22 LAB — BASIC METABOLIC PANEL
Anion gap: 8 (ref 5–15)
BUN: 19 mg/dL (ref 6–20)
CHLORIDE: 108 mmol/L (ref 98–111)
CO2: 20 mmol/L — ABNORMAL LOW (ref 22–32)
Calcium: 9.3 mg/dL (ref 8.9–10.3)
Creatinine, Ser: 0.86 mg/dL (ref 0.44–1.00)
GFR calc non Af Amer: >60 mL/min (ref >60)
Glucose, Bld: 98 mg/dL (ref 70–99)
Potassium: 3.8 mmol/L (ref 3.5–5.1)
Sodium: 136 mmol/L (ref 135–145)

## 2018-11-22 LAB — I-STAT TROPONIN, ED: TROPONIN I, POC: 0 ng/mL (ref 0.00–0.08)

## 2018-11-22 LAB — I-STAT BETA HCG BLOOD, ED (MC, WL, AP ONLY): I-stat hCG, quantitative: <5 m[IU]/mL (ref <5)

## 2018-11-22 MED ORDER — DIPHENHYDRAMINE HCL 50 MG/ML IJ SOLN
25.0000 mg | Freq: Once | INTRAMUSCULAR | Status: AC
  Administered 2018-11-22: 25 mg via INTRAVENOUS
  Filled 2018-11-22: qty 1

## 2018-11-22 MED ORDER — KETOROLAC TROMETHAMINE 30 MG/ML IJ SOLN
30.0000 mg | Freq: Once | INTRAMUSCULAR | Status: AC
  Administered 2018-11-22: 30 mg via INTRAVENOUS
  Filled 2018-11-22: qty 1

## 2018-11-22 MED ORDER — ALUM & MAG HYDROXIDE-SIMETH 200-200-20 MG/5ML PO SUSP
30.0000 mL | Freq: Once | ORAL | Status: AC
  Administered 2018-11-22: 30 mL via ORAL
  Filled 2018-11-22: qty 30

## 2018-11-22 MED ORDER — OMEPRAZOLE 20 MG PO CPDR: DELAYED_RELEASE_CAPSULE | ORAL | 0 refills | Status: DC

## 2018-11-22 MED ORDER — METOCLOPRAMIDE HCL 5 MG/ML IJ SOLN
10.0000 mg | Freq: Once | INTRAMUSCULAR | Status: AC
  Administered 2018-11-22: 10 mg via INTRAVENOUS
  Filled 2018-11-22: qty 2

## 2018-11-22 MED ORDER — FAMOTIDINE IN NACL 20-0.9 MG/50ML-% IV SOLN
20.0000 mg | Freq: Once | INTRAVENOUS | Status: AC
  Administered 2018-11-22: 20 mg via INTRAVENOUS
  Filled 2018-11-22: qty 50

## 2018-11-22 NOTE — ED Triage Notes (Signed)
Pt c/o mid center constant chest pain with sob, nausea and  headache that started this am,

## 2018-11-22 NOTE — Discharge Instructions (Addendum)
Drink plenty of fluids.  Look at the GERD diet instructions.  Take the Prilosec twice a day for the first 2 weeks then once a day.  You can take ibuprofen for your chest wall pain but take it with food.  Take 400 mg or 2 tablets over-the-counter.  Use ice and heat for comfort.  Recheck if you get worse.

## 2018-11-22 NOTE — ED Provider Notes (Signed)
[facility] EMERGENCY DEPARTMENT Provider Note   CSN: [id number] Arrival date & time: 11/22/18  0100  Time seen 1:35 AM   History   Chief Complaint Chief Complaint  Patient presents with  . Chest Pain    HPI Tiffany Townsend is a 43 y.o. female.  HPI patient states she started getting chest pain in the center of her chest the evening of January 3.  She has had constant pain since that time.  She states it never went away.  She describes the pain is dull and then she will get a sharp component that lasts less than a minute.  She has noticed eating makes the discomfort worse.  Nothing makes it feel better.  She denies getting any acid fluid in her throat.  She has had nausea all day today, January 4.  She states she just started getting acid reflux tonight just prior to coming to the ED.  She states she feels short of breath but she denies cough, fever, or vomiting.  She also has a history of headaches and denies history of migraines to me however it is in her problem list.  She states the headache is frontal and on the top of her head and describes it as a constant dull sensation.  She denies visual changes, numbness or ting of her extremities.  She states she had a headache like it once before 2 years ago.  Reviewing her medications she states she only takes the Klonopin as needed.  She had been on Protonix and Carafate this summer prescribed by her PCP.  She states she was recently diagnosed with IBS after having a colonoscopy by a gastroenterologist in [place] and was told that she did not need to be on it any longer.  She has a family history of heart disease in her paternal grandfather who died in his 87s from an MI, her father has a stent in his heart, and a paternal grandmother has had an MI.  Patient does not have diabetes or hypertension and she does not smoke or drink.   PCP Kathyrn Drown, MD   Past Medical History:  Diagnosis Date  . Anxiety   . Depression   .  Headache(784.0)    history migranes ocassionally  . IBS (irritable bowel syndrome)   . Obesity   . Sciatica of right side 11/22/2015  . UTI (urinary tract infection)     Patient Active Problem List   Diagnosis Date Noted  . Change in bowel habits 07/08/2018  . Lower abdominal pain 07/08/2018  . Generalized anxiety disorder 03/28/2017  . Sciatica of right side 11/22/2015  . Diarrhea 01/27/2015  . Panic attacks 01/27/2015    Past Surgical History:  Procedure Laterality Date  . BACK SURGERY  04/2011   disc surgery  . CHOLECYSTECTOMY N/A 03/02/2014   Procedure: LAPAROSCOPIC CHOLECYSTECTOMY;  Surgeon: [name], MD;  Location: [facility];  Service: General;  Laterality: N/A;  . CYSTO     with dilation  . URETHRAL DILATION N/A as child     OB History   No obstetric history on file.      Home Medications    Prior to Admission medications   Medication Sig Start Date End Date Taking? Authorizing Provider  clonazePAM (KLONOPIN) 0.5 MG tablet TAKE ONE TABLET BY MOUTH TWICE A DAY AS NEEDED FOR ANXIETY. 10/08/18   Kathyrn Drown, MD  dicyclomine (BENTYL) 20 MG tablet Take 1 tablet (20 mg total) by mouth 3 (  three) times daily before meals. 07/08/18   Zehr, Laban Emperor, PA-C  FLUoxetine (PROZAC) 20 MG tablet 2 qday 07/23/18   Kathyrn Drown, MD  omeprazole (PRILOSEC) 20 MG capsule Take 1 po BID x 2 weeks then once a day 11/22/18   Rolland Porter, MD  pantoprazole (PROTONIX) 40 MG tablet Take 1 tablet (40 mg total) by mouth daily. 05/20/18   Kathyrn Drown, MD  sucralfate (CARAFATE) 1 g tablet One 3 times per day 06/12/18   Kathyrn Drown, MD  tiZANidine (ZANAFLEX) 4 MG tablet Take 1 tablet (4 mg total) by mouth every 6 (six) hours as needed for muscle spasms. 04/15/17   Mikey Kirschner, MD    Family History Family History  Problem Relation Age of Onset  . Diabetes Mother   . Heart disease Mother   . Heart disease Father     Social History Social History   Tobacco Use  . Smoking  status: Former Smoker    Packs/day: 0.25    Years: 3.00    Pack years: 0.75    Types: Cigarettes    Last attempt to quit: 03/01/1998    Years since quitting: 20.7  . Smokeless tobacco: Never Used  Substance Use Topics  . Alcohol use: No  . 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    Physical Exam Updated Vital Signs BP (!) 115/58   Pulse 88   Temp (!) 97.5 F (36.4 C) (Oral)   Resp 17   Ht 5\' 4"  (1.626 m)   Wt 100.7 kg   LMP 11/22/2018   SpO2 99%   BMI 38.11 kg/m   Physical Exam Vitals signs and nursing note reviewed.  Constitutional:      General: She is in acute distress.     Appearance: Normal appearance. She is well-developed. She is not ill-appearing, toxic-appearing or diaphoretic.     Comments: Sitting upright on the side of the bed and seems distressed  HENT:     Head: Normocephalic and atraumatic.     Right Ear: External ear normal.     Left Ear: External ear normal.     Nose: Nose normal. No mucosal edema or rhinorrhea.     Mouth/Throat:     Dentition: No dental abscesses.     Pharynx: No uvula swelling.  Eyes:     Conjunctiva/sclera: Conjunctivae normal.     Pupils: Pupils are equal, round, and reactive to light.  Neck:     Musculoskeletal: Full passive range of motion without pain, normal range of motion and neck supple.  Cardiovascular:     Rate and Rhythm: Normal rate and regular rhythm.     Heart sounds: Normal heart sounds. No murmur. No friction rub. No gallop.   Pulmonary:     Effort: Pulmonary effort is normal. No respiratory distress.     Breath sounds: Normal breath sounds. No wheezing, rhonchi or rales.  Chest:     Chest wall: Tenderness present. No crepitus.    Abdominal:     General: Bowel sounds are normal. There is no distension.     Palpations: Abdomen is soft.     Tenderness: There is no abdominal tenderness.  There is no guarding or rebound.  Musculoskeletal: Normal range of motion.        General: No tenderness.     Comments:  Moves all extremities well.   Skin:    General: Skin is warm and dry.     Coloration: Skin is not pale.     Findings: No erythema or rash.  Neurological:     Mental Status: She is alert and oriented to person, place, and time.     Cranial Nerves: No cranial nerve deficit.  Psychiatric:        Mood and Affect: Mood is anxious.        Speech: Speech normal.        Behavior: Behavior normal.      ED Treatments / Results  Labs (all labs ordered are listed, but only abnormal results are displayed) Results for orders placed or performed during the hospital encounter of 50/09/38  Basic metabolic panel  Result Value Ref Range   Sodium 136 135 - 145 mmol/L   Potassium 3.8 3.5 - 5.1 mmol/L   Chloride 108 98 - 111 mmol/L   CO2 20 (L) 22 - 32 mmol/L   Glucose, Bld 98 70 - 99 mg/dL   BUN 19 6 - 20 mg/dL   Creatinine, Ser 0.86 0.44 - 1.00 mg/dL   Calcium 9.3 8.9 - 10.3 mg/dL   GFR calc non Af Amer >60 >60 mL/min   GFR calc Af Amer >60 >60 mL/min   Anion gap 8 5 - 15  CBC  Result Value Ref Range   WBC 8.2 4.0 - 10.5 K/uL   RBC 5.17 (H) 3.87 - 5.11 MIL/uL   Hemoglobin 15.0 12.0 - 15.0 g/dL   HCT 45.5 36.0 - 46.0 %   MCV 88.0 80.0 - 100.0 fL   MCH 29.0 26.0 - 34.0 pg   MCHC 33.0 30.0 - 36.0 g/dL   RDW 13.4 11.5 - 15.5 %   Platelets 269 150 - 400 K/uL   nRBC 0.0 0.0 - 0.2 %  I-Stat beta hCG blood, ED  Result Value Ref Range   I-stat hCG, quantitative <5.0 <5 mIU/mL   Comment 3          I-stat troponin, ED  Result Value Ref Range   Troponin i, poc 0.00 0.00 - 0.08 ng/mL   Comment 3           Laboratory interpretation all normal    EKG EKG Interpretation  Date/Time:  Sunday November 22 2018 01:09:20 EST Ventricular Rate:  99 PR Interval:    QRS Duration: 97 QT Interval:  362 QTC Calculation: 465 R Axis:   -111 Text Interpretation:  Sinus rhythm  Abnormal R-wave progression, late transition Inferior infarct, old No significant change since last tracing 25 Feb 2014 Confirmed by Rolland Porter [phone]) on 11/22/2018 1:12:38 AM   Radiology Dg Chest 2 View  Result Date: 11/22/2018 CLINICAL DATA:  Central chest pain. Shortness of breath. EXAM: CHEST - 2 VIEW COMPARISON:  02/25/2014 FINDINGS: The cardiomediastinal contours are normal. The lungs are clear. Pulmonary vasculature is normal. No consolidation, pleural effusion, or pneumothorax. No acute osseous abnormalities are seen. IMPRESSION: No acute chest findings. Electronically Signed   By: Keith Rake M.D.   On: 11/22/2018 02:01    Procedures Procedures (including critical care time)  Medications Ordered in ED Medications  famotidine (PEPCID) IVPB 20 mg premix (0 mg Intravenous Stopped 11/22/18 0230)  alum & mag hydroxide-simeth (MAALOX/MYLANTA) 200-200-20 MG/5ML suspension 30 mL (30 mLs Oral Given 11/22/18 0202)  ketorolac (TORADOL) 30 MG/ML injection 30 mg (30 mg Intravenous Given 11/22/18 0202)  metoCLOPramide (REGLAN) injection 10 mg (10  mg Intravenous Given 11/22/18 0202)  diphenhydrAMINE (BENADRYL) injection 25 mg (25 mg Intravenous Given 11/22/18 0202)     Initial Impression / Assessment and Plan / ED Course  I have reviewed the triage vital signs and the nursing notes.  Pertinent labs & imaging results that were available during my care of the patient were reviewed by me and considered in my medical decision making (see chart for details).     We discussed her laboratory results which had already resulted, her EKG and negative troponin are reassuring since she has had the discomfort over 24 hours.  We discussed that she is not having a cardiac event.  I suspect she may be having GERD and needs to be back on her Protonix.  She was given IV Pepcid and Maalox.  She was given Toradol for her chest wall pain.  She was given Reglan and Benadryl for her headache.  When I review her  colonoscopy it was done by Dr. Havery Moros.  Recheck at 3:48 AM patient states she is feeling much better now, she is laying in the stretcher and is obviously much less distress.  She states her headache is almost gone and her chest feels much better.  We discussed that she is having some chest wall pain and I discussed explained why it hurts when she breathes.  She also may be having some acid reflux flareup and we will put her back on a PPI.  Her headache is improved with a migraine cocktail.  Final Clinical Impressions(s) / ED Diagnoses   Final diagnoses:  Acute nonintractable headache, unspecified headache type  Costochondritis  Gastroesophageal reflux disease without esophagitis    ED Discharge Orders         Ordered    omeprazole (PRILOSEC) 20 MG capsule     11/22/18 0359         Plan discharge  Rolland Porter, MD, Barbette Or, MD 11/22/18 [phone]

## 2019-02-01 ENCOUNTER — Other Ambulatory Visit: Payer: Self-pay

## 2019-02-01 ENCOUNTER — Ambulatory Visit: Payer: Managed Care, Other (non HMO) | Admitting: Family Medicine

## 2019-02-01 ENCOUNTER — Encounter: Payer: Self-pay | Admitting: Family Medicine

## 2019-02-01 VITALS — BP 110/80 | Temp 98.0°F | Ht 64.0 in | Wt 217.0 lb

## 2019-02-01 DIAGNOSIS — K219 Gastro-esophageal reflux disease without esophagitis: Secondary | ICD-10-CM | POA: Diagnosis not present

## 2019-02-01 DIAGNOSIS — K588 Other irritable bowel syndrome: Secondary | ICD-10-CM | POA: Diagnosis not present

## 2019-02-01 DIAGNOSIS — R109 Unspecified abdominal pain: Secondary | ICD-10-CM

## 2019-02-01 MED ORDER — DICYCLOMINE HCL 20 MG PO TABS: ORAL_TABLET | ORAL | 5 refills | Status: DC

## 2019-02-01 MED ORDER — OMEPRAZOLE 20 MG PO CPDR: 20.0000 mg | DELAYED_RELEASE_CAPSULE | Freq: Every day | ORAL | 11 refills | Status: DC

## 2019-02-01 NOTE — Progress Notes (Signed)
   Subjective:    Patient ID: Tiffany Townsend, female    DOB: 1976-06-14, 43 y.o.   MRN: [id number]  HPI Patient is here today with complaints of abdominal pain and some loose stool.  She states she had to go to the hospital a month ago for chest pain and was diagnosed with Jerrye Bushy and placed on omeprazole 20 mg once per day.  Noticed symptoms started around the same time.   Had her gallbladder removed 2-3 years ago.   States she was also diagnosed with Ibs when she had a colonoscopy.  States she has to run to the restroom after eating.  On march 8 stomach started hurting bad, feeling like cramps, then got gradually worse  Pos nausea  Some incr stress   bm going every time she eats  Cramping at tines  painfu     Taking the omeprazole 20 mg once per day and states she had been on it before and it never caused any of these problems.  Review of Systems No headache, no major weight loss or weight gain, no chest pain no back pain abdominal pain no change in bowel habits complete ROS otherwise negative     Objective:   Physical Exam  Alert and oriented, vitals reviewed and stable, NAD ENT-TM's and ext canals WNL bilat via otoscopic exam Soft palate, tonsils and post pharynx WNL via oropharyngeal exam Neck-symmetric, no masses; thyroid nonpalpable and nontender Pulmonary-no tachypnea or accessory muscle use; Clear without wheezes via auscultation Card--no abnrml murmurs, rhythm reg and rate WNL Carotid pulses symmetric, without bruits       Assessment & Plan:  Impression intermittent abdominal discomfort with flare of both IBS and reflux.  Long discussion held.  Major work-up not warranted yet.  Patient was confused on medication.  Had been started on Bentyl by the specialist but then stopped taking it.  Resume today.  Also proton pump inhibitor refilled symptom care discussed warning signs discussed  Greater than 50% of this 25 minute face to face visit was spent in  counseling and discussion and coordination of care regarding the above diagnosis/diagnosies

## 2019-04-12 ENCOUNTER — Encounter: Payer: Self-pay | Admitting: Gastroenterology

## 2019-04-15 ENCOUNTER — Other Ambulatory Visit: Payer: Self-pay | Admitting: Family Medicine

## 2019-06-28 IMAGING — DX DG CHEST 2V
2 series · 2 of 2 positions shown · non-contrast
Comparison: 02/25/2014

CLINICAL DATA: Central chest pain. Shortness of breath.

EXAM:
CHEST - 2 VIEW

[chest pa]
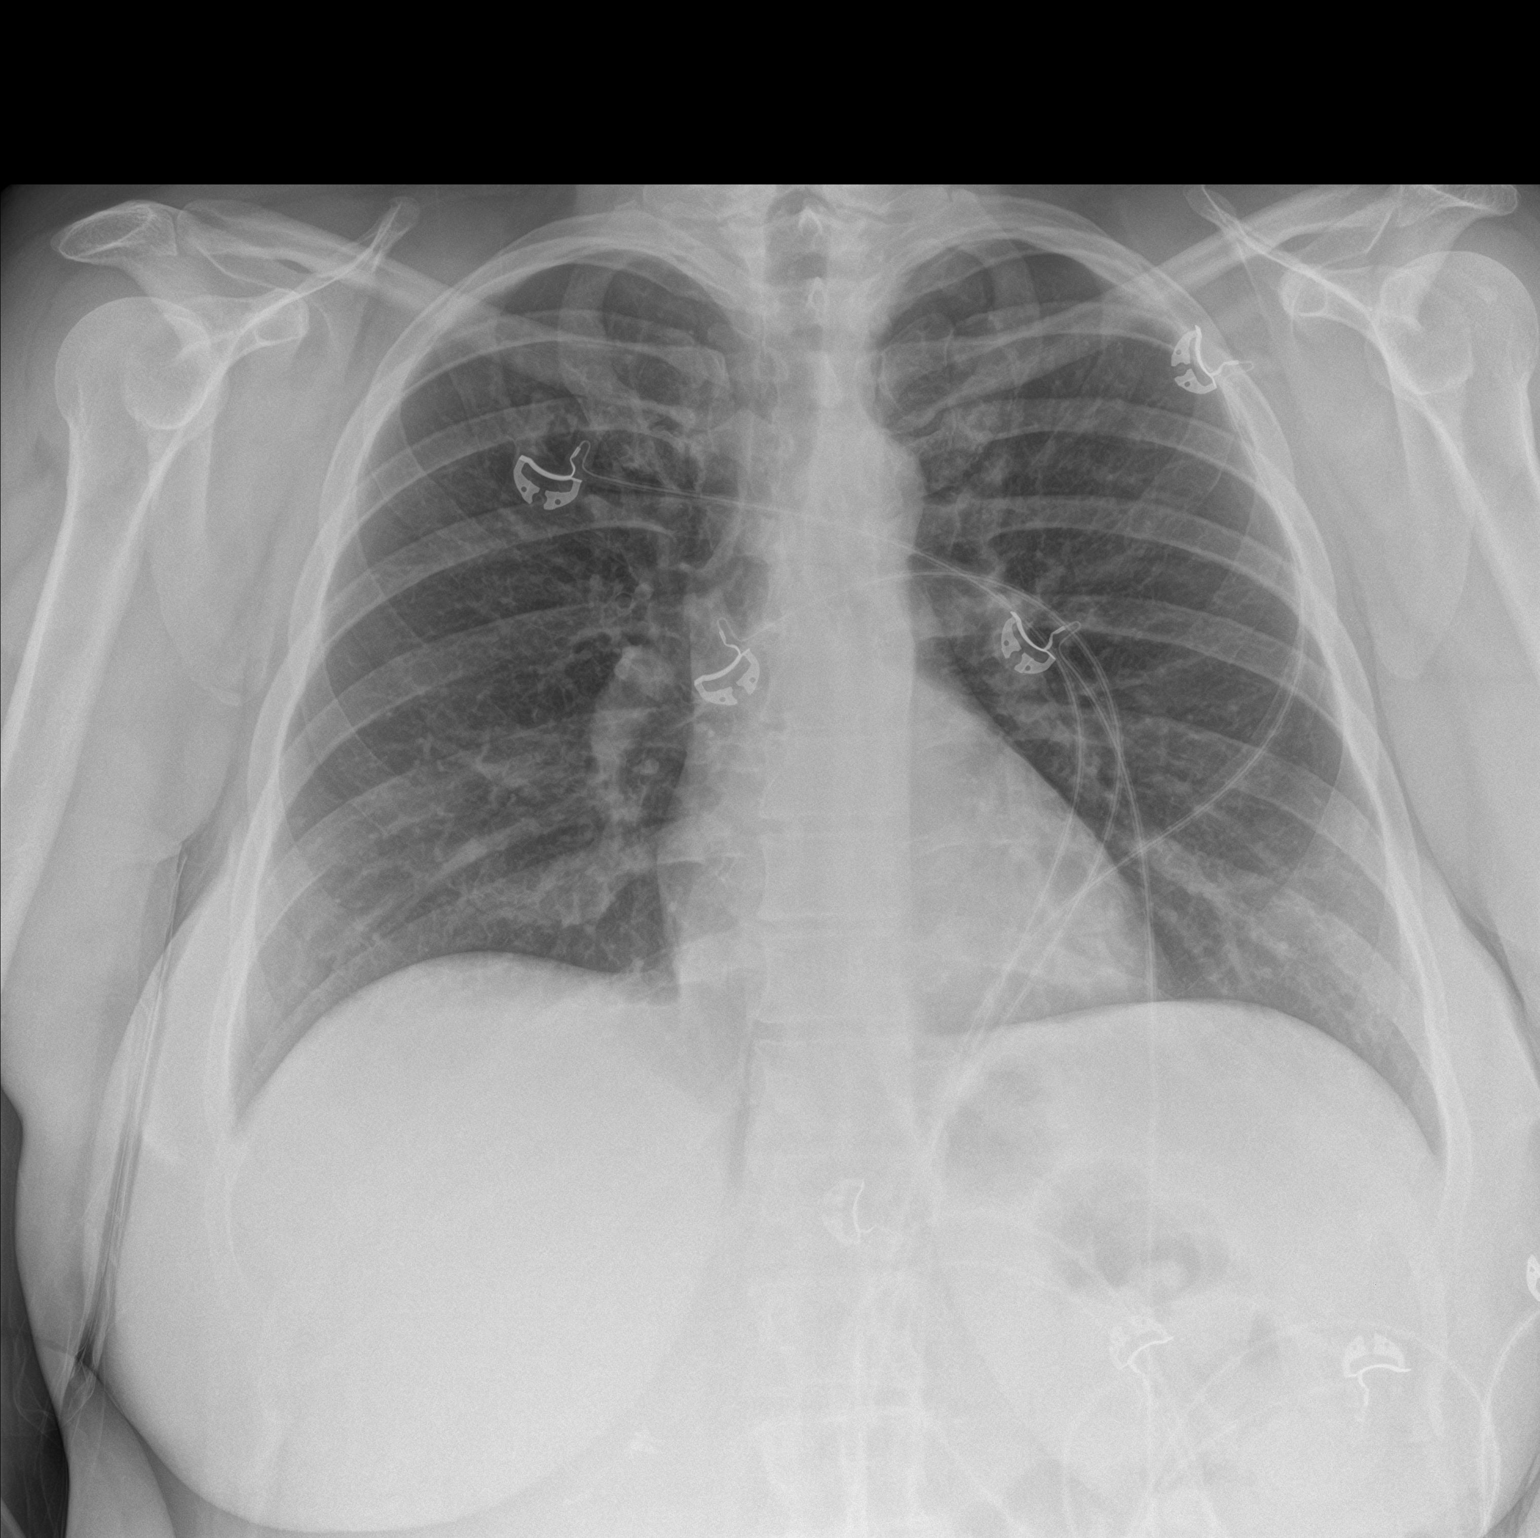

[chest lat]
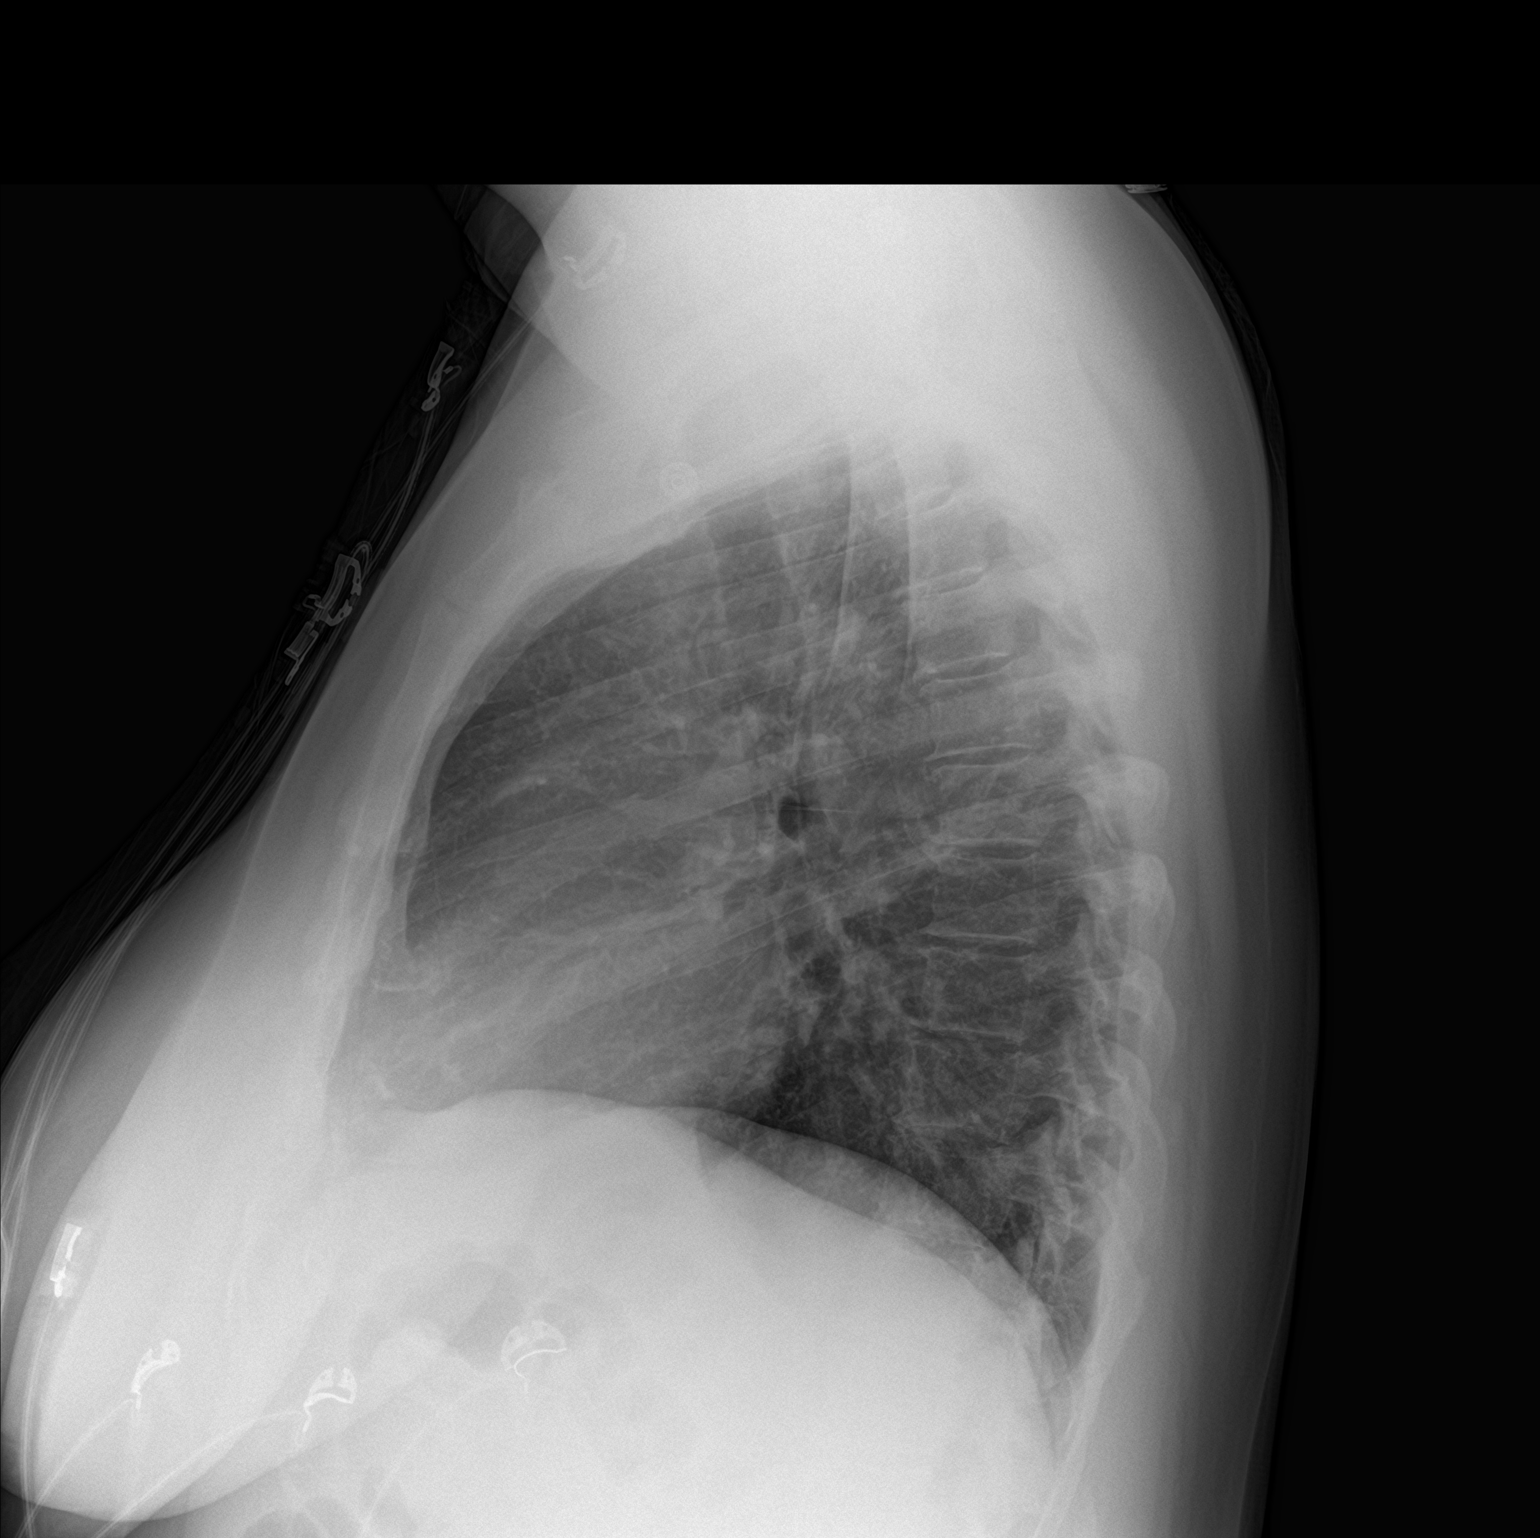

[2 of 2 positions shown; findings below may reference images not displayed]

FINDINGS: The cardiomediastinal contours are normal. The lungs are clear.
Pulmonary vasculature is normal. No consolidation, pleural effusion,
or pneumothorax. No acute osseous abnormalities are seen.
IMPRESSION: No acute chest findings.

## 2019-08-06 ENCOUNTER — Encounter (HOSPITAL_COMMUNITY): Payer: Self-pay

## 2019-08-06 ENCOUNTER — Other Ambulatory Visit: Payer: Self-pay

## 2019-08-06 DIAGNOSIS — R55 Syncope and collapse: Secondary | ICD-10-CM | POA: Diagnosis not present

## 2019-08-06 DIAGNOSIS — Z79899 Other long term (current) drug therapy: Secondary | ICD-10-CM | POA: Diagnosis not present

## 2019-08-06 DIAGNOSIS — Z87891 Personal history of nicotine dependence: Secondary | ICD-10-CM | POA: Diagnosis not present

## 2019-08-06 DIAGNOSIS — F331 Major depressive disorder, recurrent, moderate: Secondary | ICD-10-CM | POA: Diagnosis not present

## 2019-08-06 NOTE — ED Triage Notes (Signed)
Pt reports going out to eat with husband and kids, while a/w for food to come, pt started feeling like she "was going to pass out". Pt says when leaving restaurant she had a moment of confusion and could not find her car, then could not get key in car door to unlock. Pt alert and oriented x 4 in triage. Pt has multiple complaints of anxiety and depression and she said she called doctor today but could not get in.

## 2019-08-07 ENCOUNTER — Emergency Department (HOSPITAL_COMMUNITY)
Admission: EM | Admit: 2019-08-07 | Discharge: 2019-08-07 | Disposition: A | Discharge status: Home / Self Care | Payer: Managed Care, Other (non HMO) | Attending: Emergency Medicine | Admitting: Emergency Medicine

## 2019-08-07 DIAGNOSIS — R55 Syncope and collapse: Secondary | ICD-10-CM

## 2019-08-07 DIAGNOSIS — F331 Major depressive disorder, recurrent, moderate: Secondary | ICD-10-CM

## 2019-08-07 LAB — COMPREHENSIVE METABOLIC PANEL
ALT: 23 U/L (ref 0–44)
AST: 17 U/L (ref 15–41)
Albumin: 3.8 g/dL (ref 3.5–5.0)
Alkaline Phosphatase: 51 U/L (ref 38–126)
Anion gap: 8 (ref 5–15)
BUN: 13 mg/dL (ref 6–20)
CO2: 22 mmol/L (ref 22–32)
Calcium: 8.6 mg/dL — ABNORMAL LOW (ref 8.9–10.3)
Chloride: 108 mmol/L (ref 98–111)
Creatinine, Ser: 0.81 mg/dL (ref 0.44–1.00)
GFR calc Af Amer: >60 mL/min (ref >60)
GFR calc non Af Amer: >60 mL/min (ref >60)
Glucose, Bld: 100 mg/dL — ABNORMAL HIGH (ref 70–99)
Potassium: 3.5 mmol/L (ref 3.5–5.1)
Sodium: 138 mmol/L (ref 135–145)
Total Bilirubin: 0.7 mg/dL (ref 0.3–1.2)
Total Protein: 6.5 g/dL (ref 6.5–8.1)

## 2019-08-07 LAB — URINALYSIS, ROUTINE W REFLEX MICROSCOPIC
Bilirubin Urine: NEGATIVE
Glucose, UA: NEGATIVE mg/dL
Ketones, ur: NEGATIVE mg/dL
Nitrite: NEGATIVE
Protein, ur: NEGATIVE mg/dL
RBC / HPF: >50 RBC/hpf — ABNORMAL HIGH (ref 0–5)
Specific Gravity, Urine: 1.027 (ref 1.005–1.030)
pH: 5 (ref 5.0–8.0)

## 2019-08-07 LAB — CBC WITH DIFFERENTIAL/PLATELET
Abs Immature Granulocytes: 0.02 10*3/uL (ref 0.00–0.07)
Basophils Absolute: 0 10*3/uL (ref 0.0–0.1)
Basophils Relative: 1 %
Eosinophils Absolute: 0.2 10*3/uL (ref 0.0–0.5)
Eosinophils Relative: 2 %
HCT: 40.7 % (ref 36.0–46.0)
Hemoglobin: 13.5 g/dL (ref 12.0–15.0)
Immature Granulocytes: 0 %
Lymphocytes Relative: 38 %
Lymphs Abs: 2.9 10*3/uL (ref 0.7–4.0)
MCH: 29.7 pg (ref 26.0–34.0)
MCHC: 33.2 g/dL (ref 30.0–36.0)
MCV: 89.6 fL (ref 80.0–100.0)
Monocytes Absolute: 0.4 10*3/uL (ref 0.1–1.0)
Monocytes Relative: 6 %
Neutro Abs: 4.1 10*3/uL (ref 1.7–7.7)
Neutrophils Relative %: 53 %
Platelets: 226 10*3/uL (ref 150–400)
RBC: 4.54 MIL/uL (ref 3.87–5.11)
RDW: 13.2 % (ref 11.5–15.5)
WBC: 7.6 10*3/uL (ref 4.0–10.5)
nRBC: 0 % (ref 0.0–0.2)

## 2019-08-07 LAB — ACETAMINOPHEN LEVEL: Acetaminophen (Tylenol), Serum: <10 ug/mL — ABNORMAL LOW (ref 10–30)

## 2019-08-07 LAB — RAPID URINE DRUG SCREEN, HOSP PERFORMED
Amphetamines: NOT DETECTED
Barbiturates: NOT DETECTED
Benzodiazepines: NOT DETECTED
Cocaine: NOT DETECTED
Opiates: NOT DETECTED
Tetrahydrocannabinol: NOT DETECTED

## 2019-08-07 LAB — TROPONIN I (HIGH SENSITIVITY): Troponin I (High Sensitivity): <2 ng/L (ref <18)

## 2019-08-07 LAB — SALICYLATE LEVEL: Salicylate Lvl: <7 mg/dL (ref 2.8–30.0)

## 2019-08-07 NOTE — ED Provider Notes (Signed)
[facility] EMERGENCY DEPARTMENT Provider Note   CSN: [id number] Arrival date & time: 08/06/19  2107   Time seen 3:05 AM  History   Chief Complaint Chief Complaint  Patient presents with   Near Syncope    HPI Tiffany Townsend is a 43 y.o. female.     HPI patient states she has been depressed since her grandmother died on August 01, 2023.  She states she was like a second mother to her.  She states she has been taking her Klonopin and melatonin so that she sleeps.  She states she slept from Monday, Tuesday, and through Wednesday.  She states she wants to sleep because that is the only time she is not in pain.  She states she is crying because she is upset.  She states she would get up to eat but she would go back to bed.  She states she had some chest pain yesterday and then she started getting some chest pain in the ED tonight about 2 AM.  It is pain she has had before when she is upset or anxious.  The pain is in the center of her chest and described as a tightness and is constant.  Tonight they had gone out to eat with the family and she was sitting at the table waiting for their food to be served and she started feeling lightheaded.  Husband said she looked pale but she was not sweaty.  He states it lasted about 8 minutes and then she left the restaurant to go out to the parking lot and she was wandering around and could not find the car.  She finally found a card and could not figure out how to get into the car.  He states she is never been confused like that before.  She denies headache, visual changes, she did have some nausea but she has had that before with her anxiety.  She denies vomiting or diarrhea, she denies abdominal pain, fever.  She has had a long history of depression that is treated by her PCP.  She currently does not have a therapist or psychiatrist because her husband states she did not like the therapist.  She is never been admitted to psychiatric hospital.  He states this is  the worst she is ever been.  He states she has been making comments that "I am never going to be happy again".  And she also has told him she is "only happy when I am asleep".  She told me she has been having thoughts that she does not want to be here anymore but she denies any plan to harm her self.  Husband states he has been concerned that she might do something to harm her self.  She also states that since the funeral she was having problems of her leg shaking and now she shows me how her right hand shakes however if I asked her to do something else it stops and then she realizes it is not shaking and it starts shaking again.  PCP Kathyrn Drown, MD   Past Medical History:  Diagnosis Date   Anxiety    Depression    Headache(784.0)    history migranes ocassionally   IBS (irritable bowel syndrome)    Obesity    Sciatica of right side 11/22/2015   UTI (urinary tract infection)     Patient Active Problem List   Diagnosis Date Noted   Change in bowel habits 07/08/2018   Lower abdominal pain 07/08/2018  Generalized anxiety disorder 03/28/2017   Sciatica of right side 11/22/2015   Diarrhea 01/27/2015   Panic attacks 01/27/2015    Past Surgical History:  Procedure Laterality Date   BACK SURGERY  04/2011   disc surgery   CHOLECYSTECTOMY N/A 03/02/2014   Procedure: LAPAROSCOPIC CHOLECYSTECTOMY;  Surgeon: [name], MD;  Location: [facility];  Service: General;  Laterality: N/A;   CYSTO     with dilation   URETHRAL DILATION N/A as child     OB History   No obstetric history on file.      Home Medications    Prior to Admission medications   Medication Sig Start Date End Date Taking? Authorizing Provider  clonazePAM (KLONOPIN) 0.5 MG tablet TAKE ONE TABLET BY MOUTH TWICE A DAY AS NEEDED FOR ANXIETY. 04/15/19   Kathyrn Drown, MD  dicyclomine (BENTYL) 20 MG tablet Take 1 tablet (20 mg total) by mouth 3 (three) times daily before meals. 07/08/18   Zehr, Laban Emperor, PA-C  dicyclomine (BENTYL) 20 MG tablet Take three times daily 02/01/19   Mikey Kirschner, MD  FLUoxetine (PROZAC) 20 MG tablet 2 qday 07/23/18   Kathyrn Drown, MD  omeprazole (PRILOSEC) 20 MG capsule Take 1 po BID x 2 weeks then once a day 11/22/18   Rolland Porter, MD  omeprazole (PRILOSEC) 20 MG capsule Take 1 capsule (20 mg total) by mouth daily. 02/01/19   Mikey Kirschner, MD  sucralfate (CARAFATE) 1 g tablet One 3 times per day Patient not taking: Reported on 02/01/2019 06/12/18   Kathyrn Drown, MD  tiZANidine (ZANAFLEX) 4 MG tablet Take 1 tablet (4 mg total) by mouth every 6 (six) hours as needed for muscle spasms. Patient not taking: Reported on 02/01/2019 04/15/17   Mikey Kirschner, MD    Family History Family History  Problem Relation Age of Onset   Diabetes Mother    Heart disease Mother    Heart disease Father     Social History Social History   Tobacco Use   Smoking status: Former Smoker    Packs/day: 0.25    Years: 3.00    Pack years: 0.75    Types: Cigarettes    Quit date: 03/01/1998    Years since quitting: 21.4   Smokeless tobacco: Never Used  Substance Use Topics   Alcohol use: Yes    Comment: occ   Drug use: No  unemployed Lives with spouse  Allergies   Gabapentin and Codeine   Review of Systems Review of Systems  All other systems reviewed and are negative.    Physical Exam Updated Vital Signs BP 120/81 (BP Location: Left Arm) Comment: Simultaneous filing. User may not have seen previous data.   Pulse 88 Comment: Simultaneous filing. User may not have seen previous data.   Temp 98.5 F (36.9 C) (Oral)    Resp 20    Ht 5\' 3"  (1.6 m)    Wt 99.8 kg    LMP 07/06/2019    SpO2 98% Comment: Simultaneous filing. User may not have seen previous data.   BMI 38.97 kg/m   Physical Exam Vitals signs and nursing note reviewed.  Constitutional:      General: She is not in acute distress.    Appearance: Normal appearance. She is well-developed. She  is not ill-appearing or toxic-appearing.  HENT:     Head: Normocephalic and atraumatic.     Right Ear: External ear normal.     Left Ear: External  ear normal.     Nose: Nose normal. No mucosal edema or rhinorrhea.     Mouth/Throat:     Dentition: No dental abscesses.     Pharynx: No uvula swelling.  Eyes:     Extraocular Movements: Extraocular movements intact.     Conjunctiva/sclera: Conjunctivae normal.     Pupils: Pupils are equal, round, and reactive to light.  Neck:     Musculoskeletal: Full passive range of motion without pain, normal range of motion and neck supple.  Cardiovascular:     Rate and Rhythm: Normal rate and regular rhythm.     Heart sounds: Normal heart sounds. No murmur. No friction rub. No gallop.   Pulmonary:     Effort: Pulmonary effort is normal. No respiratory distress.     Breath sounds: Normal breath sounds. No wheezing, rhonchi or rales.  Chest:     Chest wall: No tenderness or crepitus.  Abdominal:     General: Bowel sounds are normal. There is no distension.     Palpations: Abdomen is soft.     Tenderness: There is no abdominal tenderness. There is no guarding or rebound.  Musculoskeletal: Normal range of motion.        General: No tenderness.     Comments: Moves all extremities well.   Skin:    General: Skin is warm and dry.     Coloration: Skin is not pale.     Findings: No erythema or rash.  Neurological:     General: No focal deficit present.     Mental Status: She is alert and oriented to person, place, and time.     Cranial Nerves: No cranial nerve deficit.  Psychiatric:        Mood and Affect: Affect is flat.        Speech: Speech normal.        Behavior: Behavior normal.        Thought Content: Thought content includes suicidal ideation. Thought content does not include homicidal ideation. Thought content does not include suicidal plan.     Comments: Patient is extremely obsessed about her right hand shaking however she did not start  doing that until had already talked her about 5 minutes.  She does not have any tremor of her tongue.  I think some of this is her making her hand shake.      ED Treatments / Results  Labs (all labs ordered are listed, but only abnormal results are displayed) Results for orders placed or performed during the hospital encounter of 08/07/19  Comprehensive metabolic panel  Result Value Ref Range   Sodium 138 135 - 145 mmol/L   Potassium 3.5 3.5 - 5.1 mmol/L   Chloride 108 98 - 111 mmol/L   CO2 22 22 - 32 mmol/L   Glucose, Bld 100 (H) 70 - 99 mg/dL   BUN 13 6 - 20 mg/dL   Creatinine, Ser 0.81 0.44 - 1.00 mg/dL   Calcium 8.6 (L) 8.9 - 10.3 mg/dL   Total Protein 6.5 6.5 - 8.1 g/dL   Albumin 3.8 3.5 - 5.0 g/dL   AST 17 15 - 41 U/L   ALT 23 0 - 44 U/L   Alkaline Phosphatase 51 38 - 126 U/L   Total Bilirubin 0.7 0.3 - 1.2 mg/dL   GFR calc non Af Amer >60 >60 mL/min   GFR calc Af Amer >60 >60 mL/min   Anion gap 8 5 - 15  CBC with Differential  Result Value  Ref Range   WBC 7.6 4.0 - 10.5 K/uL   RBC 4.54 3.87 - 5.11 MIL/uL   Hemoglobin 13.5 12.0 - 15.0 g/dL   HCT 40.7 36.0 - 46.0 %   MCV 89.6 80.0 - 100.0 fL   MCH 29.7 26.0 - 34.0 pg   MCHC 33.2 30.0 - 36.0 g/dL   RDW 13.2 11.5 - 15.5 %   Platelets 226 150 - 400 K/uL   nRBC 0.0 0.0 - 0.2 %   Neutrophils Relative % 53 %   Neutro Abs 4.1 1.7 - 7.7 K/uL   Lymphocytes Relative 38 %   Lymphs Abs 2.9 0.7 - 4.0 K/uL   Monocytes Relative 6 %   Monocytes Absolute 0.4 0.1 - 1.0 K/uL   Eosinophils Relative 2 %   Eosinophils Absolute 0.2 0.0 - 0.5 K/uL   Basophils Relative 1 %   Basophils Absolute 0.0 0.0 - 0.1 K/uL   Immature Granulocytes 0 %   Abs Immature Granulocytes 0.02 [id number] - [id number] K/uL  Salicylate level  Result Value Ref Range   Salicylate Lvl [id number] 2.8 - 30.0 mg/dL  Acetaminophen level  Result Value Ref Range   Acetaminophen (Tylenol), Serum <10 (L) 10 - 30 ug/mL  Urinalysis, Routine w reflex microscopic  Result Value Ref  Range   Color, Urine YELLOW YELLOW   APPearance HAZY (A) CLEAR   Specific Gravity, Urine 1.027 1.005 - 1.030   pH 5.0 5.0 - 8.0   Glucose, UA NEGATIVE NEGATIVE mg/dL   Hgb urine dipstick LARGE (A) NEGATIVE   Bilirubin Urine NEGATIVE NEGATIVE   Ketones, ur NEGATIVE NEGATIVE mg/dL   Protein, ur NEGATIVE NEGATIVE mg/dL   Nitrite NEGATIVE NEGATIVE   Leukocytes,Ua TRACE (A) NEGATIVE   RBC / HPF >50 (H) 0 - 5 RBC/hpf   WBC, UA 21-50 0 - 5 WBC/hpf   Bacteria, UA RARE (A) NONE SEEN   Squamous Epithelial / LPF 11-20 0 - 5   Mucus PRESENT   Urine rapid drug screen (hosp performed)  Result Value Ref Range   Opiates NONE DETECTED NONE DETECTED   Cocaine NONE DETECTED NONE DETECTED   Benzodiazepines NONE DETECTED NONE DETECTED   Amphetamines NONE DETECTED NONE DETECTED   Tetrahydrocannabinol NONE DETECTED NONE DETECTED   Barbiturates NONE DETECTED NONE DETECTED  Troponin I (High Sensitivity)  Result Value Ref Range   Troponin I (High Sensitivity) <2.0 <18 ng/L   Laboratory interpretation all normal except contaminated urine (patient states she is on her period)    EKG EKG Interpretation  Date/Time:  Saturday August 07 2019 02:36:37 EDT Ventricular Rate:  82 PR Interval:    QRS Duration: 104 QT Interval:  392 QTC Calculation: 458 R Axis:   -82 Text Interpretation:  Normal sinus rhythm Artifact Low voltage, precordial leads No significant change since last tracing 22 Nov 2018 Confirmed by Rolland Porter [phone]) on 08/07/2019 4:15:06 AM   EKG Interpretation  Date/Time:  Saturday August 07 2019 02:52:31 EDT Ventricular Rate:  88 PR Interval:    QRS Duration: 108 QT Interval:  390 QTC Calculation: 472 R Axis:   -32 Text Interpretation:  Sinus rhythm Incomplete RBBB and LAFB Low voltage, precordial leads Electrode noise No significant change since last tracing 16 minutes earlier Confirmed by Rolland Porter [phone]) on 08/07/2019 4:16:11 AM        Radiology No results  found.  Procedures Procedures (including critical care time)  Medications Ordered in ED Medications - No data to display   Initial Impression /  Assessment and Plan / ED Course  I have reviewed the triage vital signs and the nursing notes.  Pertinent labs & imaging results that were available during my care of the patient were reviewed by me and considered in my medical decision making (see chart for details).   The near syncope work-up is without acute findings.   TTS consult ordered    05:20 AM [place], TTS, recommends outpatient treatment  Final Clinical Impressions(s) / ED Diagnoses   Final diagnoses:  Near syncope  Moderate episode of recurrent major depressive disorder [facility])    ED Discharge Orders    None      Plan discharge  Rolland Porter, MD, Barbette Or, MD 08/07/19 [phone]

## 2019-08-07 NOTE — BH Assessment (Addendum)
Tele Assessment Note   Patient Name: Tiffany Townsend MRN: [id number] Referring Physician: Dr. Rolland Porter. Location of Patient: [facility].  Location of Provider: [place] is an 43 y.o. female, who presents voluntary and accompanied by her husband to [place]. Pt consented to have her husband present during the assessment. Clinician asked the pt, "what brought you to the hospital?" Pt reported, while waiting to be served at a restaurant she started feeling faint and confused. Pt reported, per her husband she looked pale in the face so they left. Pt reported, she couldn't find the car, she then unable to use the car key to get in the car. Pt reported, she was finally able to get in the car, her husband then brought her to the hospital. Pt reported, her grandmother died unexpectedly on 08-12-19. Pt reported, her grandmother was like her second mother. Pt reported, her grandmother did not drink water, would get dehydrated and need fluids. Pt reported, her grandmother was expected to return home the day she died. Pt reported, she has been sleeping more. Pt reported, she is not only happy when she is sleeping. Pt reported, she misses her grandmother and wants to see her. Pt reported, she is not suicidal, she would never hurt herself because it is against her religion. Pt reported, she would go to [place] and never see her grandmother. Pt denies, SI, HI, AVH, self-injurious behaviors and access to weapons.   Pt denies, abuse and substance use. Pt's UDS is negative. Pt denies, being linked to OPT resources (medication management and/or counseling.) Pt reported, her Primary Care Physician prescribes her medications. Pt denies, previous inpatient admissions.  Pt presents quiet, awake with logical, coherent speech. Pt's eye contact was fair. Pt's mood, affect was depressed. Pt's thought process was coherent, relevant. Pt's judgement was unimpaired. Pt was oriented x4. Pt's  concentration was normal. Pt's insight and impulse control was fair. Pt reported, if discharged from [facility] she could contract for safe. Pt's husband reported, if the pt is discharged from [place] he feel the pt will be safe.   Diagnosis: Major Depressive Disorder, recurrent, severe without psychotic features.   Past Medical History:  Past Medical History:  Diagnosis Date  . Anxiety   . Depression   . Headache(784.0)    history migranes ocassionally  . IBS (irritable bowel syndrome)   . Obesity   . Sciatica of right side 11/22/2015  . UTI (urinary tract infection)     Past Surgical History:  Procedure Laterality Date  . BACK SURGERY  04/2011   disc surgery  . CHOLECYSTECTOMY N/A 03/02/2014   Procedure: LAPAROSCOPIC CHOLECYSTECTOMY;  Surgeon: [name], MD;  Location: [facility];  Service: General;  Laterality: N/A;  . CYSTO     with dilation  . URETHRAL DILATION N/A as child    Family History:  Family History  Problem Relation Age of Onset  . Diabetes Mother   . Heart disease Mother   . Heart disease Father     Social History:  reports that she quit smoking about 21 years ago. Her smoking use included cigarettes. She has a 0.75 pack-year smoking history. She has never used smokeless tobacco. She reports current alcohol use. She reports that she does not use drugs.  Additional Social History:  Alcohol / Drug Use Pain Medications: See MAR Prescriptions: See MAR Over the Counter: See MAR History of alcohol / drug use?: No history of alcohol / drug  abuse  CIWA: CIWA-Ar BP: 120/81(Simultaneous filing. User may not have seen previous data.) Pulse Rate: 88(Simultaneous filing. User may not have seen previous data.) COWS:    Allergies:  Allergies  Allergen Reactions  . Gabapentin     migraine  . Codeine Itching    Home Medications: (Not in a hospital admission)   OB/GYN Status:  Patient's last menstrual period was 07/06/2019.  General Assessment Data Location of  Assessment: [facility] TTS Assessment: In system Is this a Tele or Face-to-Face Assessment?: Tele Assessment Is this an Initial Assessment or a Re-assessment for this encounter?: Initial Assessment Patient Accompanied by:: N/A Language Other than English: No Living Arrangements: Other (Comment)(Husband and kids. ) What gender do you identify as?: Female Marital status: Married Living Arrangements: Spouse/significant other, Children Can pt return to current living arrangement?: Yes Admission Status: Voluntary Is patient capable of signing voluntary admission?: Yes Referral Source: Self/Family/Friend Insurance type: [place] Living Arrangements: Spouse/significant other, Children Legal Guardian: Other:(Self. ) Name of Psychiatrist: NA Name of Therapist: NA  Education Status Is patient currently in school?: No Is the patient employed, unemployed or receiving disability?: Unemployed  Risk to self with the past 6 months Suicidal Ideation: No(Pt denies. ) Has patient been a risk to self within the past 6 months prior to admission? : No(Pt denies. ) Suicidal Intent: No Has patient had any suicidal intent within the past 6 months prior to admission? : No(Pt denies. ) Is patient at risk for suicide?: No Suicidal Plan?: No Has patient had any suicidal plan within the past 6 months prior to admission? : No Access to Means: No What has been your use of drugs/alcohol within the last 12 months?: Negative. Previous Attempts/Gestures: No(Pt denies. ) How many times?: 0 Other Self Harm Risks: NA Triggers for Past Attempts: None known Intentional Self Injurious Behavior: None(Pt denies. ) Family Suicide History: No Recent stressful life event(s): Other (Comment)(Recent death of grandmother. ) Persecutory voices/beliefs?: No Depression: Yes Depression Symptoms: Feeling worthless/self pity, Isolating, Fatigue, Despondent Substance abuse history and/or treatment for substance  abuse?: No(Pt denies. ) Suicide prevention information given to non-admitted patients: Not applicable  Risk to Others within the past 6 months Homicidal Ideation: No(Pt denies. ) Does patient have any lifetime risk of violence toward others beyond the six months prior to admission? : No(Pt denies. ) Thoughts of Harm to Others: No(Pt denies. ) Current Homicidal Intent: No Current Homicidal Plan: No Access to Homicidal Means: No Identified Victim: NA History of harm to others?: No Assessment of Violence: None Noted Violent Behavior Description: NA Does patient have access to weapons?: No(Pt denies. ) Criminal Charges Pending?: No Does patient have a court date: No Is patient on probation?: No  Psychosis Hallucinations: None noted(Pt denies. ) Delusions: None noted(Pt denies. )  Mental Status Report Appearance/Hygiene: Unremarkable Eye Contact: Fair Motor Activity: Unremarkable Speech: Logical/coherent Level of Consciousness: Quiet/awake Mood: Depressed Affect: Depressed Anxiety Level: Panic Attacks Panic attack frequency: Pt reported, "not a lot."  Most recent panic attack: Pt reported, two days ago.  Thought Processes: Coherent, Relevant Judgement: Unimpaired Orientation: Person, Place, Time, Situation Obsessive Compulsive Thoughts/Behaviors: None  Cognitive Functioning Concentration: Normal Memory: Recent Intact Is patient IDD: No Insight: Fair Impulse Control: Fair Appetite: Fair Have you had any weight changes? : No Change Sleep: Increased Total Hours of Sleep: 14 Vegetative Symptoms: Staying in bed  ADLScreening [facility] Assessment Services) Patient's cognitive ability adequate to safely complete daily activities?: Yes  Patient able to express need for assistance with ADLs?: Yes Independently performs ADLs?: Yes (appropriate for developmental age)  Prior Inpatient Therapy Prior Inpatient Therapy: No  Prior Outpatient Therapy Prior Outpatient Therapy: No Does  patient have an ACCT team?: No Does patient have Intensive In-House Services?  : No Does patient have [facility] services? : No Does patient have P4CC services?: No  ADL Screening (condition at time of admission) Patient's cognitive ability adequate to safely complete daily activities?: Yes Does the patient have difficulty seeing, even when wearing glasses/contacts?: No Does the patient have difficulty concentrating, remembering, or making decisions?: No Patient able to express need for assistance with ADLs?: Yes Does the patient have difficulty dressing or bathing?: No Independently performs ADLs?: Yes (appropriate for developmental age) Does the patient have difficulty walking or climbing stairs?: No Weakness of Legs: None Weakness of Arms/Hands: None  Home Assistive Devices/Equipment Home Assistive Devices/Equipment: None    Abuse/Neglect Assessment (Assessment to be complete while patient is alone) Abuse/Neglect Assessment Can Be Completed: Yes Physical Abuse: Denies(Pt denies.) Verbal Abuse: Denies(Pt denies.) Sexual Abuse: Denies(Pt denies.) Exploitation of patient/patient's resources: Denies(Pt denies.) Self-Neglect: Denies(Pt denies.)     Advance Directives (For Healthcare) Does Patient Have a Medical Advance Directive?: No Would patient like information on creating a medical advance directive?: No - Patient declined          Disposition: Lindon Romp, NP recommends pt does not meet inpatient treatment criteria, provide OPT resources  (grief counseling resources.) Disposition discussed with Dr. Tomi Bamberger and Jerene Pitch, RN.    Disposition Initial Assessment Completed for this Encounter: Yes  This service was provided via telemedicine using a 2-way, interactive audio and video technology.  Names of all persons participating in this telemedicine service and their role in this encounter. Name: Tiffany Townsend. Role: Patient.  Name: Vertell Novak, MS, [facility], [place]. Role:  Counselor.           Vertell Novak 08/07/2019 5:50 AM     Vertell Novak, [place], [facility], [facility] Triage Specialist [phone]

## 2019-08-07 NOTE — ED Notes (Signed)
Patient talking to husband stating that she was confused earlier and did not know what happened to her earlier. Updated patients vitals, also did orthostatic at this time. Patient started to shake and rocking back and forth. Husband asked her what she was doing, patient stated that she did not know.  Patient placed back in bed.

## 2019-08-07 NOTE — Discharge Instructions (Addendum)
Please consider getting a psychiatrist and a therapist to help you with your depression.  Also let your primary care doctor know about your ED visit.  Return to the ED if you feel like you are going to do something to harm yourself or somebody else.

## 2019-08-16 ENCOUNTER — Other Ambulatory Visit: Payer: Self-pay

## 2019-08-16 ENCOUNTER — Ambulatory Visit: Payer: Managed Care, Other (non HMO) | Admitting: Family Medicine

## 2019-11-09 ENCOUNTER — Other Ambulatory Visit: Payer: Self-pay | Admitting: Family Medicine

## 2019-11-10 NOTE — Telephone Encounter (Signed)
Needs office visit Please send script

## 2019-11-11 NOTE — Telephone Encounter (Signed)
Pt scheduled 1/5 

## 2019-11-23 ENCOUNTER — Ambulatory Visit (INDEPENDENT_AMBULATORY_CARE_PROVIDER_SITE_OTHER): Payer: Managed Care, Other (non HMO) | Admitting: Family Medicine

## 2019-11-23 ENCOUNTER — Other Ambulatory Visit: Payer: Self-pay

## 2019-11-23 DIAGNOSIS — F410 Panic disorder [episodic paroxysmal anxiety] without agoraphobia: Secondary | ICD-10-CM | POA: Diagnosis not present

## 2019-11-23 DIAGNOSIS — F411 Generalized anxiety disorder: Secondary | ICD-10-CM | POA: Diagnosis not present

## 2019-11-23 MED ORDER — FLUOXETINE HCL 20 MG PO TABS: ORAL_TABLET | ORAL | 5 refills | Status: DC

## 2019-11-23 MED ORDER — CLONAZEPAM 0.5 MG PO TABS: ORAL_TABLET | ORAL | 3 refills | Status: DC

## 2019-11-23 NOTE — Progress Notes (Signed)
   Subjective:    Patient ID: Tiffany Townsend, female    DOB: 21-Mar-1976, 44 y.o.   MRN: [id number]  HPI Pt here for med check. Pt is needing refill on anxiety meds. Pt states she is doing well. Pt has no issues or concerns at this.  Patient been having a fair amount of anxiety and also some panic attacks and feeling depressed a lot of this related to family issues and the stress of Covid as well as a lot of sickness patient is requesting to be back on antidepressant she denies being suicidal.  Denies any major setbacks.  PMH benign anxiety depression Virtual Visit via Video Note  I connected with Tiffany Townsend on 11/23/19 at  8:30 AM EST by a video enabled telemedicine application and verified that I am speaking with the correct person using two identifiers.  Location: Patient: home Provider: office   I discussed the limitations of evaluation and management by telemedicine and the availability of in person appointments. The patient expressed understanding and agreed to proceed.  History of Present Illness:    Observations/Objective:   Assessment and Plan:   Follow Up Instructions:    I discussed the assessment and treatment plan with the patient. The patient was provided an opportunity to ask questions and all were answered. The patient agreed with the plan and demonstrated an understanding of the instructions.   The patient was advised to call back or seek an in-person evaluation if the symptoms worsen or if the condition fails to improve as anticipated. ` I provided 16 minutes of non-face-to-face time during this encounter.   Vicente Males, LPN    Review of Systems  Constitutional: Positive for fatigue. Negative for activity change and appetite change.  HENT: Negative for congestion and rhinorrhea.   Respiratory: Negative for cough and shortness of breath.   Cardiovascular: Negative for chest pain and leg swelling.  Gastrointestinal: Negative for abdominal pain and  diarrhea.  Endocrine: Negative for polydipsia and polyphagia.  Skin: Negative for color change.  Neurological: Negative for dizziness and weakness.  Psychiatric/Behavioral: Negative for behavioral problems and confusion.       Objective:   Physical Exam Patient had virtual visit Appears to be in no distress Atraumatic Neuro able to relate and oriented No apparent resp distress Color normal  Patient was told that she should start feeling suicidal or worse to follow-up immediately seek help here or ER      Assessment & Plan:  Major depression Anxiety Panic attacks Medications recommended Start antidepressant Follow-up in 3 weeks Nerve medication twice daily as needed caution drowsiness Counseling recommended patient will consider

## 2019-12-21 ENCOUNTER — Other Ambulatory Visit: Payer: Self-pay

## 2019-12-21 ENCOUNTER — Encounter: Payer: Managed Care, Other (non HMO) | Admitting: Family Medicine

## 2019-12-21 ENCOUNTER — Ambulatory Visit (INDEPENDENT_AMBULATORY_CARE_PROVIDER_SITE_OTHER): Payer: Managed Care, Other (non HMO) | Admitting: Family Medicine

## 2019-12-21 DIAGNOSIS — F411 Generalized anxiety disorder: Secondary | ICD-10-CM

## 2019-12-21 DIAGNOSIS — M545 Low back pain, unspecified: Secondary | ICD-10-CM

## 2019-12-21 NOTE — Progress Notes (Signed)
   Subjective:    Patient ID: Tiffany Townsend, female    DOB: July 17, 1976, 44 y.o.   MRN: [id number]  HPI    Review of Systems     Objective:   Physical Exam        Assessment & Plan:

## 2019-12-21 NOTE — Progress Notes (Signed)
   Subjective:    Patient ID: Tiffany Townsend, female    DOB: 1976-02-24, 44 y.o.   MRN: [id number]  Anxiety Presents for follow-up visit. Patient reports no chest pain, confusion, dizziness or shortness of breath. The quality of sleep is poor.    pt prescribed Clonazepam and Fluxotine at previous visit in January. Pt states she is doing ok; no side effects from meds.  Significant depression issues but overall feels like she is making some improvement compared to where she was.  Denies any major setbacks currently. Pt believes she has pulled a muscle in her lower back. Pt is having to take care of mom and mom is bed bound at this time. Pt states that sometimes she will be standing and her leg will spasm. Pt has been taking Tylenol and Advil together and that has helped some.  Patient does relate intermittent back pains intermittent spasms down the leg denies any major setbacks denies any weakness denies any sciatica symptoms. Virtual Visit via Video Note  I connected with HARKIRAN DELHOMME on 12/21/19 at  3:00 PM EST by a video enabled telemedicine application and verified that I am speaking with the correct person using two identifiers.  Location: Patient: home Provider: office   I discussed the limitations of evaluation and management by telemedicine and the availability of in person appointments. The patient expressed understanding and agreed to proceed.  History of Present Illness:    Observations/Objective:   Assessment and Plan:   Follow Up Instructions:    I discussed the assessment and treatment plan with the patient. The patient was provided an opportunity to ask questions and all were answered. The patient agreed with the plan and demonstrated an understanding of the instructions.   The patient was advised to call back or seek an in-person evaluation if the symptoms worsen or if the condition fails to improve as anticipated.  I provided 17 minutes of non-face-to-face time  during this encounter.        Review of Systems  Constitutional: Negative for activity change, appetite change and fatigue.  HENT: Negative for congestion and rhinorrhea.   Respiratory: Negative for cough and shortness of breath.   Cardiovascular: Negative for chest pain and leg swelling.  Gastrointestinal: Negative for abdominal pain and diarrhea.  Endocrine: Negative for polydipsia and polyphagia.  Musculoskeletal: Positive for back pain.  Skin: Negative for color change.  Neurological: Negative for dizziness and weakness.  Psychiatric/Behavioral: Negative for behavioral problems and confusion.       Objective:   Physical Exam   Patient had virtual visit Appears to be in no distress Atraumatic Neuro able to relate and oriented No apparent resp distress Color normal     Assessment & Plan:  Back-strain-exercises discussed will mail her some exercises to follow No need for medications or imaging currently  Depression with anxiety some improvement stick with current medication we will follow-up with phone call in a couple weeks and follow-up in 6 weeks for a virtual visit

## 2019-12-22 NOTE — Progress Notes (Signed)
This encounter was created in error - please disregard.

## 2020-08-02 ENCOUNTER — Other Ambulatory Visit: Payer: Self-pay | Admitting: Family Medicine

## 2020-08-02 NOTE — Telephone Encounter (Signed)
Appt scheduled for 09/05/2020 - please refill med

## 2020-09-05 ENCOUNTER — Other Ambulatory Visit: Payer: Self-pay | Admitting: *Deleted

## 2020-09-05 ENCOUNTER — Other Ambulatory Visit: Payer: Self-pay

## 2020-09-05 ENCOUNTER — Ambulatory Visit: Payer: Managed Care, Other (non HMO) | Admitting: Family Medicine

## 2020-09-05 ENCOUNTER — Encounter: Payer: Self-pay | Admitting: Family Medicine

## 2020-09-05 VITALS — HR 107 | Temp 97.4°F | Ht 63.0 in | Wt 230.0 lb

## 2020-09-05 DIAGNOSIS — F411 Generalized anxiety disorder: Secondary | ICD-10-CM

## 2020-09-05 DIAGNOSIS — F410 Panic disorder [episodic paroxysmal anxiety] without agoraphobia: Secondary | ICD-10-CM

## 2020-09-05 DIAGNOSIS — F4321 Adjustment disorder with depressed mood: Secondary | ICD-10-CM

## 2020-09-05 MED ORDER — FLUOXETINE HCL 20 MG PO TABS: ORAL_TABLET | ORAL | 5 refills | Status: DC

## 2020-09-05 MED ORDER — CLONAZEPAM 0.5 MG PO TABS: ORAL_TABLET | ORAL | 5 refills | Status: DC

## 2020-09-05 NOTE — Progress Notes (Signed)
   Subjective:    Patient ID: Tiffany Townsend, female    DOB: [birth year], 44 y.o.   MRN: [id number]  HPImed check up on anxiety. Pt states she feels like she is going to have a panic attack while in the office today. Takes klonopin and it helps some. Has not taken today. Grandfather recently passed.   Patient with sadness finds her self feeling somewhat depressed and anxious.  Recently lost her grandfather who was essentially a father figure for her.  Patient has been very stressed.  At times finds her self feeling down blue sad anxious.  Not suicidal.  Her husband and family are support networks but she is not counseling with anyone currently.  Review of Systems Please see above.    Objective:   Physical Exam 20 minutes spent with patient discussing depression anxiety medication and how she is doing       Assessment & Plan:  Grief Stress Depression and anxiety May continue clonazepam twice daily as needed for anxiousness.  Recommend follow-up sooner if problems Recommend counseling with behavioral health Also recommend patient work her way through the grief including allowing herself to feel sad yet at the same time being thankful for who her grand father was to her Bump up the dose of the Prozac to 2 daily 40 mg Follow-up 3 to 4 weeks follow-up sooner if problems

## 2020-09-27 ENCOUNTER — Ambulatory Visit: Payer: Managed Care, Other (non HMO) | Admitting: Family Medicine

## 2020-10-03 ENCOUNTER — Other Ambulatory Visit: Payer: Managed Care, Other (non HMO)

## 2020-10-03 ENCOUNTER — Other Ambulatory Visit: Payer: Self-pay | Admitting: *Deleted

## 2020-10-03 DIAGNOSIS — Z20822 Contact with and (suspected) exposure to covid-19: Secondary | ICD-10-CM

## 2020-10-05 LAB — SARS-COV-2, NAA 2 DAY TAT

## 2020-10-05 LAB — SPECIMEN STATUS REPORT

## 2020-10-05 LAB — NOVEL CORONAVIRUS, NAA: SARS-CoV-2, NAA: NOT DETECTED

## 2020-10-17 ENCOUNTER — Ambulatory Visit: Payer: Managed Care, Other (non HMO) | Admitting: Family Medicine

## 2020-11-06 ENCOUNTER — Ambulatory Visit: Payer: Managed Care, Other (non HMO) | Admitting: Family Medicine

## 2020-11-06 ENCOUNTER — Other Ambulatory Visit: Payer: Self-pay

## 2020-11-06 ENCOUNTER — Encounter: Payer: Self-pay | Admitting: Family Medicine

## 2020-11-06 VITALS — BP 122/82 | HR 46 | Temp 97.1°F | Ht 63.0 in | Wt 218.6 lb

## 2020-11-06 DIAGNOSIS — M545 Low back pain, unspecified: Secondary | ICD-10-CM

## 2020-11-06 MED ORDER — PREDNISONE 20 MG PO TABS: ORAL_TABLET | ORAL | 0 refills | Status: DC

## 2020-11-06 NOTE — Patient Instructions (Signed)
Do not take OTC NSAIDs while you are on the prednisone.      Acute Back Pain, Adult Acute back pain is sudden and usually short-lived. It is often caused by an injury to the muscles and tissues in the back. The injury may result from:  A muscle or ligament getting overstretched or torn (strained). Ligaments are tissues that connect bones to each other. Lifting something improperly can cause a back strain.  Wear and tear (degeneration) of the spinal disks. Spinal disks are circular tissue that provides cushioning between the bones of the spine (vertebrae).  Twisting motions, such as while playing sports or doing yard work.  A hit to the back.  Arthritis. You may have a physical exam, lab tests, and imaging tests to find the cause of your pain. Acute back pain usually goes away with rest and home care. Follow these instructions at home: Managing pain, stiffness, and swelling  Take over-the-counter and prescription medicines only as told by your health care provider.  Your health care provider may recommend applying ice during the first 24-48 hours after your pain starts. To do this: ? Put ice in a plastic bag. ? Place a towel between your skin and the bag. ? Leave the ice on for 20 minutes, 2-3 times a day.  If directed, apply heat to the affected area as often as told by your health care provider. Use the heat source that your health care provider recommends, such as a moist heat pack or a heating pad. ? Place a towel between your skin and the heat source. ? Leave the heat on for 20-30 minutes. ? Remove the heat if your skin turns bright red. This is especially important if you are unable to feel pain, heat, or cold. You have a greater risk of getting burned. Activity   Do not stay in bed. Staying in bed for more than 1-2 days can delay your recovery.  Sit up and stand up straight. Avoid leaning forward when you sit, or hunching over when you stand. ? If you work at a desk, sit  close to it so you do not need to lean over. Keep your chin tucked in. Keep your neck drawn back, and keep your elbows bent at a right angle. Your arms should look like the letter "L." ? Sit high and close to the steering wheel when you drive. Add lower back (lumbar) support to your car seat, if needed.  Take short walks on even surfaces as soon as you are able. Try to increase the length of time you walk each day.  Do not sit, drive, or stand in one place for more than 30 minutes at a time. Sitting or standing for long periods of time can put stress on your back.  Do not drive or use heavy machinery while taking prescription pain medicine.  Use proper lifting techniques. When you bend and lift, use positions that put less stress on your back: ? [place] your knees. ? Keep the load close to your body. ? Avoid twisting.  Exercise regularly as told by your health care provider. Exercising helps your back heal faster and helps prevent back injuries by keeping muscles strong and flexible.  Work with a physical therapist to make a safe exercise program, as recommended by your health care provider. Do any exercises as told by your physical therapist. Lifestyle  Maintain a healthy weight. Extra weight puts stress on your back and makes it difficult to have good posture.  Avoid activities or situations that make you feel anxious or stressed. Stress and anxiety increase muscle tension and can make back pain worse. Learn ways to manage anxiety and stress, such as through exercise. General instructions  Sleep on a firm mattress in a comfortable position. Try lying on your side with your knees slightly bent. If you lie on your back, put a pillow under your knees.  Follow your treatment plan as told by your health care provider. This may include: ? Cognitive or behavioral therapy. ? Acupuncture or massage therapy. ? Meditation or yoga. Contact a health care provider if:  You have pain that is not  relieved with rest or medicine.  You have increasing pain going down into your legs or buttocks.  Your pain does not improve after 2 weeks.  You have pain at night.  You lose weight without trying.  You have a fever or chills. Get help right away if:  You develop new bowel or bladder control problems.  You have unusual weakness or numbness in your arms or legs.  You develop nausea or vomiting.  You develop abdominal pain.  You feel faint. Summary  Acute back pain is sudden and usually short-lived.  Use proper lifting techniques. When you bend and lift, use positions that put less stress on your back.  Take over-the-counter and prescription medicines and apply heat or ice as directed by your health care provider. This information is not intended to replace advice given to you by your health care provider. Make sure you discuss any questions you have with your health care provider. Document Revised: 02/23/2019 Document Reviewed: 06/18/2017 Elsevier Patient Education  [place].

## 2020-11-06 NOTE — Progress Notes (Signed)
Back pain for 2 weeks. Unable to get out of bed for a few days. Pt was cleaning the litter boxes, squatting to clean out box. Pt felt pain while cleaning litter box and was barely able to stand up. Pt called daughter to help her get up and called husband home from school due to unable to move. Pt rested and took Advil and has been using back brace and cane.   Pt states front informed her that she could not do a video visit and her husband came to office to get an appt or have message sent back and he was told to leave.     Patient ID: Tiffany Townsend, female    DOB: 1976-07-13, 44 y.o.   MRN: [id number]   Chief Complaint  Patient presents with  . Back Pain   Subjective:  CC: back pain  This is a new problem.  Presents today with a complaint of back pain.  Reports that she was squatting to clean out the litter boxes, and on the way back up she started having severe back pain.  This happened 2 weeks ago.  Has tried Advil every 4-6 hours which has helped to some she reports that all the symptoms on the left side of her body, leg and foot tingling has needed help to get out of bed however on the third day she was able to get out of bed by herself.  2 months ago she saw a chiropractor, and he reports that he is really unable to help her situation too much.  Her baseline she does have some back pain, she has had a previous left side laminectomy and also has degenerative disc disease.  She was treated with steroids for back pain in July.    Medical History Siah has a past medical history of Anxiety, Depression, Headache(784.0), IBS (irritable bowel syndrome), Obesity, Sciatica of right side (11/22/2015), and UTI (urinary tract infection).   Outpatient Encounter Medications as of 11/06/2020  Medication Sig  . clonazePAM (KLONOPIN) 0.5 MG tablet 1 bid prn  . FLUoxetine (PROZAC) 20 MG tablet 2 tablet qday  . predniSONE (DELTASONE) 20 MG tablet Take 3 tablets by mouth for 3 days, then 2 tablets by mouth  for 3 days, then 1 tablet by mouth for 3 days.   Facility-Administered Encounter Medications as of 11/06/2020  Medication  . 0.9 %  sodium chloride infusion     Review of Systems  Constitutional: Negative for chills and fever.  HENT: Negative for ear pain.   Respiratory: Negative for shortness of breath.   Cardiovascular: Negative for chest pain.  Gastrointestinal: Negative for abdominal pain.  Musculoskeletal: Positive for back pain and gait problem. Negative for joint swelling, neck pain and neck stiffness.     Vitals BP 122/82   Pulse (!) 46   Temp (!) 97.1 F (36.2 C)   Ht 5\' 3"  (1.6 m)   Wt 218 lb 9.6 oz (99.2 kg)   SpO2 97%   BMI 38.72 kg/m   Objective:   Physical Exam Vitals reviewed.  Constitutional:      General: She is not in acute distress.    Appearance: Normal appearance.  Cardiovascular:     Rate and Rhythm: Normal rate and regular rhythm.     Heart sounds: Normal heart sounds.  Pulmonary:     Effort: Pulmonary effort is normal.     Breath sounds: Normal breath sounds.  Skin:    General: Skin is warm and dry.  Neurological:     General: No focal deficit present.     Mental Status: She is alert.     Cranial Nerves: Cranial nerves are intact.     Motor: No weakness.     Comments: Able to perform right straight leg raise, difficulty performing a left straight leg raise.  Pain is reported mid lower back radiating down the front of the leg not radiating down the sciatic nerve.  She is using her back brace today and a cane for mobility.  Able to ambulate very slowly denies any saddle paresthesia, denies any change in bowel and bladder, able to take a few steps on her tiptoes.  Psychiatric:        Behavior: Behavior normal.      Assessment and Plan   1. Acute left-sided low back pain without sciatica - predniSONE (DELTASONE) 20 MG tablet; Take 3 tablets by mouth for 3 days, then 2 tablets by mouth for 3 days, then 1 tablet by mouth for 3 days.   Dispense: 18 tablet; Refill: 0 - Ambulatory referral to Physical Therapy   Will treat back pain with prednisone taper, consulted with Dr. Sallee Lange while patient in the office.  Recommend physical therapy and back stretches to be done as tolerated.  Examples of exercises given.  Agrees with plan of care discussed today. Understands warning signs to seek further care: Chest pain, shortness of breath, any significant weakness in lower extremities, any significant change in health. Understands to follow-up in 2 weeks.  Hopefully will get into physical therapy soon, instructed to perform back stretches and exercises as tolerated.  She is instructed to not take nonsteroidal anti-inflammatories at the same time as taking prednisone.  Chalmers Guest, NP 11/06/2020

## 2020-11-21 ENCOUNTER — Telehealth (INDEPENDENT_AMBULATORY_CARE_PROVIDER_SITE_OTHER): Payer: Managed Care, Other (non HMO) | Admitting: Family Medicine

## 2020-11-21 ENCOUNTER — Telehealth: Payer: Self-pay | Admitting: Family Medicine

## 2020-11-21 DIAGNOSIS — M545 Low back pain, unspecified: Secondary | ICD-10-CM | POA: Diagnosis not present

## 2020-11-21 MED ORDER — DICLOFENAC SODIUM 75 MG PO TBEC: DELAYED_RELEASE_TABLET | ORAL | 0 refills | Status: DC

## 2020-11-21 NOTE — Progress Notes (Signed)
   Subjective:    Patient ID: Tiffany Townsend, female    DOB: [birth year], 45 y.o.   MRN: [id number]  HPI Pt following up on back pain. Pt states back pain has improved. Right leg has been involuntary jerking for about 4-5 days. No swelling but having some tingling.  Patient relates intermittent tingling in the leg as well as intermittent jerking but denies any severe discomfort in in her back states energy level overall doing fairly good using Tylenol and occasionally ibuprofen doing some exercises that we have shown her patient does feel she is doing better compared to where she was several weeks ago Virtual Visit via Video Note  I connected with Tiffany Townsend on 11/21/20 at  1:10 PM EST by a video enabled telemedicine application and verified that I am speaking with the correct person using two identifiers.  Location: Patient: home Provider: office   I discussed the limitations of evaluation and management by telemedicine and the availability of in person appointments. The patient expressed understanding and agreed to proceed.  History of Present Illness:    Observations/Objective:   Assessment and Plan:   Follow Up Instructions:    I discussed the assessment and treatment plan with the patient. The patient was provided an opportunity to ask questions and all were answered. The patient agreed with the plan and demonstrated an understanding of the instructions.   The patient was advised to call back or seek an in-person evaluation if the symptoms worsen or if the condition fails to improve as anticipated.  I provided 20 including chart review documentation minutes of non-face-to-face time during this encounter.       Review of Systems Please see above    Objective:   Physical Exam  Patient had virtual visit-video Appears to be in no distress Atraumatic Neuro able to relate and oriented No apparent resp distress Color normal       Assessment & Plan:  Lumbar  pain Leg twitching More than likely irritated nerve Proper approach discussed Stretching exercises Physical therapy within the next 2 weeks already has referral We will send an anti-inflammatory she can take in the evening time and place of ibuprofen If not dramatically better in the next 2 to 3 weeks consider further evaluation Patient will give [place] an update within a few weeks how she is doing

## 2020-11-21 NOTE — Telephone Encounter (Signed)
Ms. Tiffany Townsend, Tiffany Townsend are scheduled for a virtual visit with your provider today.    Just as we do with appointments in the office, we must obtain your consent to participate.  Your consent will be active for this visit and any virtual visit you may have with one of our providers in the next 365 days.    If you have a MyChart account, I can also send a copy of this consent to you electronically.  All virtual visits are billed to your insurance company just like a traditional visit in the office.  As this is a virtual visit, video technology does not allow for your provider to perform a traditional examination.  This may limit your provider's ability to fully assess your condition.  If your provider identifies any concerns that need to be evaluated in person or the need to arrange testing such as labs, EKG, etc, we will make arrangements to do so.    Although advances in technology are sophisticated, we cannot ensure that it will always work on either your end or our end.  If the connection with a video visit is poor, we may have to switch to a telephone visit.  With either a video or telephone visit, we are not always able to ensure that we have a secure connection.   I need to obtain your verbal consent now.   Are you willing to proceed with your visit today?   Tiffany Townsend has provided verbal consent on 11/21/2020 for a virtual visit (video or telephone).   Marlowe Shores, LPN 07/23/1739  81:44 AM

## 2021-03-23 ENCOUNTER — Other Ambulatory Visit: Payer: Self-pay | Admitting: Family Medicine

## 2021-05-04 ENCOUNTER — Ambulatory Visit: Payer: Managed Care, Other (non HMO) | Admitting: Family Medicine

## 2021-05-04 ENCOUNTER — Other Ambulatory Visit: Payer: Self-pay

## 2021-05-04 ENCOUNTER — Encounter: Payer: Self-pay | Admitting: Family Medicine

## 2021-05-04 VITALS — BP 122/74 | HR 102 | Temp 97.5°F | Ht 63.0 in | Wt 231.0 lb

## 2021-05-04 DIAGNOSIS — F411 Generalized anxiety disorder: Secondary | ICD-10-CM | POA: Diagnosis not present

## 2021-05-04 DIAGNOSIS — F410 Panic disorder [episodic paroxysmal anxiety] without agoraphobia: Secondary | ICD-10-CM

## 2021-05-04 DIAGNOSIS — Z13220 Encounter for screening for lipoid disorders: Secondary | ICD-10-CM

## 2021-05-04 DIAGNOSIS — R000 Tachycardia, unspecified: Secondary | ICD-10-CM | POA: Diagnosis not present

## 2021-05-04 NOTE — Progress Notes (Signed)
   Subjective:    Patient ID: Tiffany Townsend, female    DOB: 1976-03-10, 45 y.o.   MRN: [id number]  HPI  Elevated BP readings and HR  Patient having elevated blood pressure readings and heart rate.  This is concerning her.  She finds her self feeling nervous at times.  Denies chest pain or shortness of breath.  Denies any excessive swelling.  No history of this no family history of any premature issues  Has significant anxiety finds her self worrying a lot with symptoms panicking at times denies being depressed currently.  She stopped taking her Prozac.  She was under the impression that medicine was for depression.  She is taking her nerve pills. Review of Systems     Objective:   Physical Exam General-in no acute distress Eyes-no discharge Lungs-respiratory rate normal, CTA CV-no murmurs,RRR Extremities skin warm dry no edema Neuro grossly normal Behavior normal, alert        Assessment & Plan:  We did discuss how the nerve medication is essentially just a temporary measure and does not treat the underlying issue Gave her some self-help material Also reinitiate Prozac 1 daily Follow-up within 6 weeks Rationale for medication discussed along with side effects  EKG looks good no acute changes.  Blood pressure is good.  I find no evidence of any underlying health issue but it is important to do some lab work.  More than likely elevated heart rate related to anxiety but certainly if she has severe troubles will refer to cardiology for telemetry

## 2021-05-05 LAB — LIPID PANEL
Chol/HDL Ratio: 6.7 ratio — ABNORMAL HIGH (ref 0.0–4.4)
Cholesterol, Total: 202 mg/dL — ABNORMAL HIGH (ref 100–199)
HDL: 30 mg/dL — ABNORMAL LOW (ref >39)
LDL Chol Calc (NIH): 121 mg/dL — ABNORMAL HIGH (ref 0–99)
Triglycerides: 287 mg/dL — ABNORMAL HIGH (ref 0–149)
VLDL Cholesterol Cal: 51 mg/dL — ABNORMAL HIGH (ref 5–40)

## 2021-05-05 LAB — COMPREHENSIVE METABOLIC PANEL
ALT: 54 IU/L — ABNORMAL HIGH (ref 0–32)
AST: 42 IU/L — ABNORMAL HIGH (ref 0–40)
Albumin/Globulin Ratio: 1.6 (ref 1.2–2.2)
Albumin: 4.3 g/dL (ref 3.8–4.8)
Alkaline Phosphatase: 69 IU/L (ref 44–121)
BUN/Creatinine Ratio: 7 — ABNORMAL LOW (ref 9–23)
BUN: 6 mg/dL (ref 6–24)
Bilirubin Total: 0.8 mg/dL (ref 0.0–1.2)
CO2: 23 mmol/L (ref 20–29)
Calcium: 9.5 mg/dL (ref 8.7–10.2)
Chloride: 101 mmol/L (ref 96–106)
Creatinine, Ser: 0.91 mg/dL (ref 0.57–1.00)
Globulin, Total: 2.7 g/dL (ref 1.5–4.5)
Glucose: 99 mg/dL (ref 65–99)
Potassium: 4.5 mmol/L (ref 3.5–5.2)
Sodium: 139 mmol/L (ref 134–144)
Total Protein: 7 g/dL (ref 6.0–8.5)
eGFR: 80 mL/min/{1.73_m2} (ref >59)

## 2021-05-05 LAB — T4, FREE: Free T4: 1.68 ng/dL (ref 0.82–1.77)

## 2021-05-05 LAB — TSH: TSH: 0.835 u[IU]/mL (ref 0.450–4.500)

## 2021-05-10 ENCOUNTER — Telehealth: Payer: Self-pay | Admitting: Family Medicine

## 2021-05-10 ENCOUNTER — Other Ambulatory Visit: Payer: Self-pay | Admitting: Family Medicine

## 2021-05-10 MED ORDER — CLONAZEPAM 0.5 MG PO TABS: ORAL_TABLET | ORAL | 1 refills | Status: DC

## 2021-05-10 MED ORDER — FLUOXETINE HCL 20 MG PO TABS: ORAL_TABLET | ORAL | 2 refills | Status: DC

## 2021-05-10 NOTE — Telephone Encounter (Signed)
Patient was seen on 6/17 and was told medication would be called in but none was. She has been to [facility] twice. Please advise

## 2021-05-10 NOTE — Telephone Encounter (Signed)
Patient made aware of the prescription that was sent to the pharmacy, she is also requesting an clonazepam refill, please advise.

## 2021-05-10 NOTE — Telephone Encounter (Signed)
Please advise. Thank you

## 2021-05-10 NOTE — Telephone Encounter (Signed)
Medication was sent in  Apologizing for not sending it originally thank you To keep follow-up at the end of July.

## 2021-05-11 ENCOUNTER — Encounter: Payer: Self-pay | Admitting: *Deleted

## 2021-05-11 NOTE — Telephone Encounter (Signed)
This was also sent, may send her a MyChart message thank you

## 2021-05-11 NOTE — Telephone Encounter (Signed)
Patient notified via my chart message.

## 2021-06-15 ENCOUNTER — Ambulatory Visit: Payer: Managed Care, Other (non HMO) | Admitting: Family Medicine

## 2021-10-01 ENCOUNTER — Other Ambulatory Visit (HOSPITAL_COMMUNITY): Payer: Self-pay | Admitting: Family Medicine

## 2021-10-01 DIAGNOSIS — N6310 Unspecified lump in the right breast, unspecified quadrant: Secondary | ICD-10-CM

## 2021-10-03 ENCOUNTER — Other Ambulatory Visit: Payer: Self-pay

## 2021-10-03 ENCOUNTER — Emergency Department (HOSPITAL_COMMUNITY)
Admission: EM | Admit: 2021-10-03 | Discharge: 2021-10-04 | Disposition: A | Discharge status: Other Acute Inpatient Hospital | Payer: Managed Care, Other (non HMO) | Attending: Emergency Medicine | Admitting: Emergency Medicine

## 2021-10-03 ENCOUNTER — Encounter (HOSPITAL_COMMUNITY): Payer: Self-pay | Admitting: Emergency Medicine

## 2021-10-03 ENCOUNTER — Emergency Department (HOSPITAL_COMMUNITY): Payer: Managed Care, Other (non HMO)

## 2021-10-03 DIAGNOSIS — T424X2A Poisoning by benzodiazepines, intentional self-harm, initial encounter: Secondary | ICD-10-CM | POA: Diagnosis not present

## 2021-10-03 DIAGNOSIS — Z20822 Contact with and (suspected) exposure to covid-19: Secondary | ICD-10-CM | POA: Diagnosis not present

## 2021-10-03 DIAGNOSIS — R000 Tachycardia, unspecified: Secondary | ICD-10-CM | POA: Insufficient documentation

## 2021-10-03 DIAGNOSIS — Y900 Blood alcohol level of less than 20 mg/100 ml: Secondary | ICD-10-CM | POA: Insufficient documentation

## 2021-10-03 DIAGNOSIS — T50902A Poisoning by unspecified drugs, medicaments and biological substances, intentional self-harm, initial encounter: Secondary | ICD-10-CM

## 2021-10-03 DIAGNOSIS — F332 Major depressive disorder, recurrent severe without psychotic features: Secondary | ICD-10-CM | POA: Insufficient documentation

## 2021-10-03 DIAGNOSIS — Z87891 Personal history of nicotine dependence: Secondary | ICD-10-CM | POA: Diagnosis not present

## 2021-10-03 DIAGNOSIS — F419 Anxiety disorder, unspecified: Secondary | ICD-10-CM | POA: Insufficient documentation

## 2021-10-03 LAB — RAPID URINE DRUG SCREEN, HOSP PERFORMED
Amphetamines: NOT DETECTED
Barbiturates: NOT DETECTED
Benzodiazepines: NOT DETECTED
Cocaine: NOT DETECTED
Opiates: NOT DETECTED
Tetrahydrocannabinol: NOT DETECTED

## 2021-10-03 LAB — CBC WITH DIFFERENTIAL/PLATELET
Abs Immature Granulocytes: 0.02 10*3/uL (ref 0.00–0.07)
Basophils Absolute: 0 10*3/uL (ref 0.0–0.1)
Basophils Relative: 1 %
Eosinophils Absolute: 0.2 10*3/uL (ref 0.0–0.5)
Eosinophils Relative: 3 %
HCT: 40.6 % (ref 36.0–46.0)
Hemoglobin: 13.9 g/dL (ref 12.0–15.0)
Immature Granulocytes: 0 %
Lymphocytes Relative: 30 %
Lymphs Abs: 2.4 10*3/uL (ref 0.7–4.0)
MCH: 30 pg (ref 26.0–34.0)
MCHC: 34.2 g/dL (ref 30.0–36.0)
MCV: 87.7 fL (ref 80.0–100.0)
Monocytes Absolute: 0.4 10*3/uL (ref 0.1–1.0)
Monocytes Relative: 5 %
Neutro Abs: 4.9 10*3/uL (ref 1.7–7.7)
Neutrophils Relative %: 61 %
Platelets: 196 10*3/uL (ref 150–400)
RBC: 4.63 MIL/uL (ref 3.87–5.11)
RDW: 13.2 % (ref 11.5–15.5)
WBC: 8 10*3/uL (ref 4.0–10.5)
nRBC: 0 % (ref 0.0–0.2)

## 2021-10-03 LAB — COMPREHENSIVE METABOLIC PANEL
ALT: 46 U/L — ABNORMAL HIGH (ref 0–44)
AST: 35 U/L (ref 15–41)
Albumin: 3.7 g/dL (ref 3.5–5.0)
Alkaline Phosphatase: 50 U/L (ref 38–126)
Anion gap: 8 (ref 5–15)
BUN: 14 mg/dL (ref 6–20)
CO2: 21 mmol/L — ABNORMAL LOW (ref 22–32)
Calcium: 8.8 mg/dL — ABNORMAL LOW (ref 8.9–10.3)
Chloride: 106 mmol/L (ref 98–111)
Creatinine, Ser: 0.69 mg/dL (ref 0.44–1.00)
GFR, Estimated: >60 mL/min (ref >60)
Glucose, Bld: 110 mg/dL — ABNORMAL HIGH (ref 70–99)
Potassium: 4 mmol/L (ref 3.5–5.1)
Sodium: 135 mmol/L (ref 135–145)
Total Bilirubin: 0.9 mg/dL (ref 0.3–1.2)
Total Protein: 6.7 g/dL (ref 6.5–8.1)

## 2021-10-03 LAB — TROPONIN I (HIGH SENSITIVITY)
Troponin I (High Sensitivity): <2 ng/L (ref <18)
Troponin I (High Sensitivity): <2 ng/L (ref <18)

## 2021-10-03 LAB — SALICYLATE LEVEL: Salicylate Lvl: <7 mg/dL — ABNORMAL LOW (ref 7.0–30.0)

## 2021-10-03 LAB — ETHANOL: Alcohol, Ethyl (B): <10 mg/dL (ref <10)

## 2021-10-03 LAB — ACETAMINOPHEN LEVEL
Acetaminophen (Tylenol), Serum: <10 ug/mL — ABNORMAL LOW (ref 10–30)
Acetaminophen (Tylenol), Serum: <10 ug/mL — ABNORMAL LOW (ref 10–30)

## 2021-10-03 LAB — POC URINE PREG, ED: Preg Test, Ur: NEGATIVE

## 2021-10-03 MED ORDER — CLONAZEPAM 0.5 MG PO TABS: 0.5000 mg | ORAL_TABLET | Freq: Two times a day (BID) | ORAL | Status: DC | PRN

## 2021-10-03 NOTE — ED Provider Notes (Signed)
[facility] EMERGENCY DEPARTMENT Provider Note   CSN: [id number] Arrival date & time: 10/03/21  0849     History Chief Complaint  Patient presents with   Drug Overdose   Psychiatric Evaluation    Tiffany Townsend is a 45 y.o. female.  45 year old female with history of anxiety, depression presents after intentional overdose on Klonopin today.  Patient states that her father was given a cancer diagnosis back in July and this has made her very depressed.  Reports a strained relationship with her mother, her mother recently made a comment towards patient's daughter which upset the patient.  Patient dropped her kids off at school this morning, went home and around 830 this morning it took a 7 of her 0.5 mg Klonopin tablets.  Patient then texted her brother "to take care of daddy."  Which concerned patient's brother and prompted him to call her husband.  Patient states that she has had chest pain and shortness of breath off and on for the past 2 days, midsternal in location, appears to be correlated with her emotional symptoms.  Patient generally takes 2 of her Klonopin daily, sometimes takes these as separate doses, sometimes combines the doses.  States that she just wants to sleep so that she does not have to feel anything.  Denies coingestions with any other medications or alcohol.  Patient has been in contact with a therapist locally ([place]?)  But does not have a confirmed appointment.      Past Medical History:  Diagnosis Date   Anxiety    Depression    Headache(784.0)    history migranes ocassionally   IBS (irritable bowel syndrome)    Obesity    Sciatica of right side 11/22/2015   UTI (urinary tract infection)     Patient Active Problem List   Diagnosis Date Noted   Acute left-sided low back pain without sciatica 11/06/2020   Change in bowel habits 07/08/2018   Lower abdominal pain 07/08/2018   Generalized anxiety disorder 03/28/2017   Sciatica of right side 11/22/2015    Diarrhea 01/27/2015   Panic attacks 01/27/2015    Past Surgical History:  Procedure Laterality Date   BACK SURGERY  04/2011   disc surgery   CHOLECYSTECTOMY N/A 03/02/2014   Procedure: LAPAROSCOPIC CHOLECYSTECTOMY;  Surgeon: [name], MD;  Location: [facility];  Service: General;  Laterality: N/A;   CYSTO     with dilation   URETHRAL DILATION N/A as child     OB History   No obstetric history on file.     Family History  Problem Relation Age of Onset   Diabetes Mother    Heart disease Mother    Heart disease Father     Social History   Tobacco Use   Smoking status: Former    Packs/day: 0.25    Years: 3.00    Pack years: 0.75    Types: Cigarettes    Quit date: 03/01/1998    Years since quitting: 23.6   Smokeless tobacco: Never  Substance Use Topics   Alcohol use: Yes    Comment: occ   Drug use: No    Home Medications Prior to Admission medications   Medication Sig Start Date End Date Taking? Authorizing Provider  clonazePAM (KLONOPIN) 0.5 MG tablet TAKE ONE TABLET BY MOUTH TWICE A DAY AS NEEDED FOR ANXIETY. Patient taking differently: Take 0.5 mg by mouth 2 (two) times daily as needed for anxiety. 05/10/21  Yes Kathyrn Drown, MD  FLUoxetine (PROZAC) 20 MG tablet 1 tablet qday Patient not taking: Reported on 10/03/2021 05/10/21   Kathyrn Drown, MD    Allergies    Gabapentin and Codeine  Review of Systems   Review of Systems  Constitutional:  Negative for chills and fever.  Respiratory:  Positive for shortness of breath.   Cardiovascular:  Positive for chest pain.  Gastrointestinal:  Negative for abdominal pain, nausea and vomiting.  Musculoskeletal:  Negative for arthralgias and myalgias.  Skin:  Negative for rash and wound.  Allergic/Immunologic: Negative for immunocompromised state.  Neurological:  Negative for weakness.  Psychiatric/Behavioral:  Positive for suicidal ideas.   All other systems reviewed and are negative.  Physical Exam Updated  Vital Signs BP 126/85   Pulse 97   Resp 18   Ht 5\' 3"  (1.6 m)   Wt 104.3 kg   LMP 09/19/2021   SpO2 98%   BMI 40.74 kg/m   Physical Exam Vitals and nursing note reviewed.  Constitutional:      General: She is not in acute distress.    Appearance: She is well-developed. She is not diaphoretic.     Comments: Sleeping, wakes to verbal stimuli and is able to participate in history and physical although continues to appear sleepy.  HENT:     Head: Normocephalic and atraumatic.     Nose: Nose normal.     Mouth/Throat:     Mouth: Mucous membranes are moist.  Eyes:     Extraocular Movements: Extraocular movements intact.     Conjunctiva/sclera: Conjunctivae normal.  Cardiovascular:     Rate and Rhythm: Regular rhythm. Tachycardia present.     Pulses: Normal pulses.     Heart sounds: Normal heart sounds.  Pulmonary:     Effort: Pulmonary effort is normal.     Breath sounds: Normal breath sounds.  Abdominal:     Palpations: Abdomen is soft.     Tenderness: There is no abdominal tenderness.  Musculoskeletal:     Cervical back: Neck supple.     Right lower leg: No edema.     Left lower leg: No edema.  Skin:    General: Skin is warm and dry.     Findings: No erythema or rash.  Neurological:     Mental Status: She is oriented to person, place, and time.     Motor: No weakness.  Psychiatric:        Behavior: Behavior normal.    ED Results / Procedures / Treatments   Labs (all labs ordered are listed, but only abnormal results are displayed) Labs Reviewed  COMPREHENSIVE METABOLIC PANEL - Abnormal; Notable for the following components:      Result Value   CO2 21 (*)    Glucose, Bld 110 (*)    Calcium 8.8 (*)    ALT 46 (*)    All other components within normal limits  ACETAMINOPHEN LEVEL - Abnormal; Notable for the following components:   Acetaminophen (Tylenol), Serum <10 (*)    All other components within normal limits  SALICYLATE LEVEL - Abnormal; Notable for the  following components:   Salicylate Lvl <9.3 (*)    All other components within normal limits  ACETAMINOPHEN LEVEL - Abnormal; Notable for the following components:   Acetaminophen (Tylenol), Serum <10 (*)    All other components within normal limits  RESP PANEL BY RT-PCR (FLU A&B, COVID) ARPGX2  ETHANOL  RAPID URINE DRUG SCREEN, HOSP PERFORMED  CBC WITH DIFFERENTIAL/PLATELET  POC URINE PREG, ED  TROPONIN I (HIGH SENSITIVITY)  TROPONIN I (HIGH SENSITIVITY)    EKG EKG Interpretation  Date/Time:  Wednesday October 03 2021 09:02:41 EST Ventricular Rate:  112 PR Interval:  151 QRS Duration: 84 QT Interval:  338 QTC Calculation: 462 R Axis:   263 Text Interpretation: Sinus tachycardia Left anterior fascicular block Low voltage, precordial leads Consider right ventricular hypertrophy Consider anterior infarct No significant change since last tracing Confirmed by Calvert Cantor [phone]) on 10/03/2021 9:06:06 AM  Radiology DG Chest Port 1 View  Result Date: 10/03/2021 CLINICAL DATA:  Chest pain, overdose EXAM: PORTABLE CHEST 1 VIEW COMPARISON:  11/22/2018 FINDINGS: The heart size and mediastinal contours are within normal limits. Both lungs are clear. The visualized skeletal structures are unremarkable. IMPRESSION: No acute abnormality of the lungs in [facility] portable projection. Electronically Signed   By: Delanna Ahmadi M.D.   On: 10/03/2021 09:48    Procedures Procedures   Medications Ordered in ED Medications  clonazePAM (KLONOPIN) tablet 0.5 mg (has no administration in time range)    ED Course  I have reviewed the triage vital signs and the nursing notes.  Pertinent labs & imaging results that were available during my care of the patient were reviewed by me and considered in my medical decision making (see chart for details).  Clinical Course as of 10/03/21 1730  Wed Oct 03, 1662  1330 45 year old female presents after intentionally taking 7 of her 0.5 mg Klonopin with intent  for self-harm. On exam, she is sleepy although arouses to verbal stimuli and is able to participate in history and physical.  She is tachycardic. Poison control recommends 4 to 6 hours of monitoring and 4-hour Tylenol level. Patient is monitored, vitals have improved, maintains O2 sat 98% on room air.  CBC is unremarkable, CMP without significant findings.  Alcohol, Tylenol, aspirin levels are negative.  Repeat Tylenol is also negative.  hCG negative.  UDS negative. Patient is evaluated by behavioral health that she is medically cleared for behavioral health disposition planning.  Behavioral health recommends inpatient treatment. [LM]  1420 Home medications reviewed and ordered.  Patient reports not taking her Prozac, this was not ordered.  Patient has been taking Klonopin regularly.  To prevent withdrawal, will order her prescribed 0.5 mg twice daily as needed dosing starting tomorrow. [LM]    Clinical Course User Index [LM] Roque Lias   MDM Rules/Calculators/A&P                           Final Clinical Impression(s) / ED Diagnoses Final diagnoses:  Intentional overdose, initial encounter [facility])    Rx / [place] Orders ED Discharge Orders     None        Roque Lias 10/03/21 1730    Truddie Hidden, MD 10/03/21 2030
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
## 2021-10-03 NOTE — ED Notes (Signed)
Patient's belongings were removed from room and placed in psych locker room. Patient has 2 bags of clothing.  Patient did leave her leggings on, but husband checked and no personal items on person.

## 2021-10-03 NOTE — ED Notes (Signed)
Pt and visitor wanded by security

## 2021-10-03 NOTE — ED Notes (Signed)
Per [facility] pt meets inpatient criteria . MD aware

## 2021-10-03 NOTE — Progress Notes (Signed)
Inpatient Behavioral Health Placement  Pt meets inpatient criteria per Pecolia Ades, NP. There are no appropriate beds at [facility]. Referral was sent to the following facilities;   Destination Service Provider Address Phone Fax  [address] [place] [address] [phone]  [facility]  [address] [place] [address] [phone] [place] [address] [phone] [facility]  [place], [place] [address] [phone]  [facility]  [address] [place] [address] [phone] [facility]  [address] [place] [address] [phone]  [facility]  [address], [place] [address] [phone] [place]  [address] [place] [address] [phone] [facility]  [address] [place] [address] [phone] [place]  [address], [place] [address] [phone] [facility]  [address] [place] [address] [phone] [facility]  [place] [address] [phone]  [place] [address] [place] [address] [phone] [place], [place] [address] [phone]  [facility]  [address], [place] [address] [phone]  [facility]  [address] [place]  [address] [phone]    Situation ongoing,  CSW will follow up.   Benjaman Kindler, MSW, LCSWA 10/03/2021  @ 10:08 PM

## 2021-10-03 NOTE — Progress Notes (Signed)
CSW contacted [facility] regarding availability at [facility] for Pt. AC Tye [place] has agreed to leave a notice for an evening AC to review. 2nd shift CSW will follow up.    Mariea Clonts, MSW, LCSW-A  1:50 PM 10/03/2021

## 2021-10-03 NOTE — ED Triage Notes (Signed)
Pt c/o of depression and taking 7 kolonopin today of a unknown dose. Pt usually takes 2 a day but felt more depression this morning and took 7. States she is not SI/ or HI but wants to just feel different. Family at bedside states this has been an issue on and off over the past years.

## 2021-10-03 NOTE — ED Notes (Addendum)
Per poison control pt needs a tylenol level at 1230 and cardiac monitor 4-6 hours. PA made aware

## 2021-10-04 LAB — RESP PANEL BY RT-PCR (FLU A&B, COVID) ARPGX2
Influenza A by PCR: NEGATIVE
Influenza B by PCR: NEGATIVE
SARS Coronavirus 2 by RT PCR: NEGATIVE

## 2021-10-04 NOTE — ED Notes (Signed)
Called pt's husband and informed him where pt is going and she would like clothes brought to her. He stated he would be here after 0745 today

## 2021-10-04 NOTE — ED Notes (Addendum)
Kia from [facility] called and stated pt has a bed and can come after 9am today (11/17)  Unit is [facility] Accepting doctor is Dr. Kathlene Cote Call report to [phone]

## 2021-10-09 ENCOUNTER — Encounter (HOSPITAL_COMMUNITY): Payer: Managed Care, Other (non HMO)

## 2021-10-09 ENCOUNTER — Ambulatory Visit (HOSPITAL_COMMUNITY): Payer: Managed Care, Other (non HMO)

## 2021-10-09 ENCOUNTER — Other Ambulatory Visit (HOSPITAL_COMMUNITY): Payer: Managed Care, Other (non HMO)

## 2021-10-10 ENCOUNTER — Inpatient Hospital Stay: Payer: Managed Care, Other (non HMO) | Admitting: Family Medicine

## 2021-10-16 ENCOUNTER — Ambulatory Visit (HOSPITAL_COMMUNITY): Payer: Managed Care, Other (non HMO)

## 2021-10-16 ENCOUNTER — Encounter (HOSPITAL_COMMUNITY): Payer: Managed Care, Other (non HMO)

## 2021-10-30 ENCOUNTER — Ambulatory Visit (INDEPENDENT_AMBULATORY_CARE_PROVIDER_SITE_OTHER): Payer: Managed Care, Other (non HMO) | Admitting: Family Medicine

## 2021-10-30 ENCOUNTER — Encounter: Payer: Self-pay | Admitting: Family Medicine

## 2021-10-30 ENCOUNTER — Other Ambulatory Visit: Payer: Self-pay

## 2021-10-30 VITALS — BP 120/87 | HR 102 | Temp 97.2°F | Wt 225.2 lb

## 2021-10-30 DIAGNOSIS — F322 Major depressive disorder, single episode, severe without psychotic features: Secondary | ICD-10-CM | POA: Diagnosis not present

## 2021-10-30 NOTE — Progress Notes (Signed)
° °  Subjective:    Patient ID: Tiffany Townsend, female    DOB: [birth year], 45 y.o.   MRN: [id number]  HPI Pt here for follow up from ER on 10/03/21. Pt took 7 Klonipin on 10/03/21. Pt would like throat checked due throat being sore possibly from new med. Pt brought in med bottles that she had when she went to [facility]. Pt went to [facility] 10/05/21-10/[facility]. [facility] diagnosed pt with ADD and PTSD>. Patient under a lot of situational stress Her dad was recently diagnosed with bile duct cancer He is going through therapy but unfortunately could not do surgery She is under a lot of stress with this finds her self feeling very sad Does not really have anyone who helps her out emotionally Finds her self feeling depressed and anxious but not suicidal.  Review of Systems     Objective:   Physical Exam Lungs clear heart regular pulse normal BP good       Assessment & Plan:  Major depression Wellbutrin 150 mg XL daily Trazodone 50 mg may use 1-1/2 each evening to help with sleep Follow-up if progressive troubles otherwise recheck 4 weeks See therapist on Monday patient to find out if therapist does not prescribe she will notify us and we will be refills of her medicines Patient states not suicidal currently We did discuss coping mechanisms to help her cope with the cancer diagnosis of her dad Obviously to follow-up immediately with [place] therapist or ER if feeling like she is losing control of things

## 2021-11-27 ENCOUNTER — Ambulatory Visit: Payer: Managed Care, Other (non HMO) | Admitting: Family Medicine

## 2021-11-27 ENCOUNTER — Other Ambulatory Visit: Payer: Self-pay

## 2021-11-27 ENCOUNTER — Encounter: Payer: Self-pay | Admitting: Family Medicine

## 2021-11-27 VITALS — BP 122/82 | Ht 63.0 in | Wt 230.0 lb

## 2021-11-27 DIAGNOSIS — F322 Major depressive disorder, single episode, severe without psychotic features: Secondary | ICD-10-CM

## 2021-11-27 DIAGNOSIS — F411 Generalized anxiety disorder: Secondary | ICD-10-CM | POA: Diagnosis not present

## 2021-11-27 MED ORDER — DULOXETINE HCL 30 MG PO CPEP: 30.0000 mg | ORAL_CAPSULE | Freq: Every day | ORAL | 0 refills | Status: DC

## 2021-11-27 MED ORDER — HYDROXYZINE HCL 25 MG PO TABS: ORAL_TABLET | ORAL | 1 refills | Status: DC

## 2021-11-27 NOTE — Progress Notes (Signed)
° °  Subjective:    Patient ID: Tiffany Townsend, female    DOB: 29-Mar-1976, 46 y.o.   MRN: [id number]  HPI  Patient arrives to follow up on depression. Patient states she was told that maybe one of her meds was not strong enough-Intuniv Her counseling did not go well with the counselor in [place].  She did not click with this counselor and stopped going Still having some depression symptoms.  In addition to this states she did not tolerate Wellbutrin and stopped taking it She is trying to do the best she can but finds her self still feeling depressed and anxious but not suicidal Review of Systems     Objective:   Physical Exam  Lungs clear heart regular pulse normal      Assessment & Plan:  Anxiety and depression Recommend starting Cymbalta 30 mg 1 daily for the first 4 weeks Recheck in approximately 4 weeks at that point plan to increase to 60 mg Also reestablish counseling with different counselor If patient starts becoming despondent severe depression worse or suicidal immediately follow-up or ER

## 2021-12-25 ENCOUNTER — Other Ambulatory Visit: Payer: Self-pay

## 2021-12-25 ENCOUNTER — Ambulatory Visit (INDEPENDENT_AMBULATORY_CARE_PROVIDER_SITE_OTHER): Payer: Managed Care, Other (non HMO) | Admitting: Family Medicine

## 2021-12-25 VITALS — BP 122/76 | Ht 63.0 in | Wt 223.6 lb

## 2021-12-25 DIAGNOSIS — F322 Major depressive disorder, single episode, severe without psychotic features: Secondary | ICD-10-CM

## 2021-12-25 DIAGNOSIS — F411 Generalized anxiety disorder: Secondary | ICD-10-CM | POA: Diagnosis not present

## 2021-12-25 MED ORDER — DULOXETINE HCL 60 MG PO CPEP: 60.0000 mg | ORAL_CAPSULE | Freq: Every day | ORAL | 5 refills | Status: DC

## 2021-12-25 NOTE — Progress Notes (Signed)
° °  Subjective:    Patient ID: Tiffany Townsend, female    DOB: [birth year], 46 y.o.   MRN: [id number]  HPI  Patient arrives for a follow up on depression. Patient states she has no problems or concerns. Patient with mild to moderate depression symptoms.  What makes things difficult as her dad has advanced cancer more than likely only will live 18 months-possibly up to 5 years.  Also recently had an MRI and unfortunately needs open heart surgery but is not a good candidate for this because of all this it is created increased anxiety she is seeing a counselor but she would like to consider a different counselor she denies being suicidal. Review of Systems     Objective:   Physical Exam  Lungs clear heart regular HEENT benign      Assessment & Plan:  Severe depression Bump up the dose of Cymbalta instead of 30 mg recommend 60 mg Give [place] feedback within the next few weeks how things go Follow-up within 4 to 6 weeks Different counseling recommended Vonna Kotyk sheets is a Social worker that I think would hopefully be helpful for her we could check into see if that is a doable process

## 2021-12-25 NOTE — Progress Notes (Signed)
12/25/21-referral placed

## 2021-12-25 NOTE — Addendum Note (Signed)
Addended by: Vicente Males on: 12/25/2021 02:25 PM   Modules accepted: Orders

## 2022-01-16 ENCOUNTER — Other Ambulatory Visit: Payer: Self-pay

## 2022-01-16 ENCOUNTER — Ambulatory Visit (INDEPENDENT_AMBULATORY_CARE_PROVIDER_SITE_OTHER): Payer: Managed Care, Other (non HMO) | Admitting: Clinical

## 2022-01-16 ENCOUNTER — Encounter (HOSPITAL_COMMUNITY): Payer: Self-pay

## 2022-01-16 DIAGNOSIS — F419 Anxiety disorder, unspecified: Secondary | ICD-10-CM

## 2022-01-16 DIAGNOSIS — F331 Major depressive disorder, recurrent, moderate: Secondary | ICD-10-CM
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
## 2022-01-16 NOTE — Plan of Care (Signed)
Verbal Consent 

## 2022-02-11 ENCOUNTER — Ambulatory Visit: Payer: Managed Care, Other (non HMO) | Admitting: Family Medicine

## 2022-02-13 ENCOUNTER — Other Ambulatory Visit: Payer: Self-pay

## 2022-02-13 ENCOUNTER — Ambulatory Visit (HOSPITAL_COMMUNITY): Payer: Self-pay | Admitting: Clinical

## 2022-03-11 ENCOUNTER — Ambulatory Visit: Payer: Managed Care, Other (non HMO) | Admitting: Family Medicine

## 2022-03-15 ENCOUNTER — Ambulatory Visit (INDEPENDENT_AMBULATORY_CARE_PROVIDER_SITE_OTHER): Payer: 59 | Admitting: Clinical

## 2022-03-15 DIAGNOSIS — F331 Major depressive disorder, recurrent, moderate: Secondary | ICD-10-CM

## 2022-03-15 DIAGNOSIS — F419 Anxiety disorder, unspecified: Secondary | ICD-10-CM | POA: Diagnosis not present

## 2022-03-15 NOTE — Progress Notes (Signed)
Virtual Visit via Telephone Note ?  ?I connected with Tiffany Townsend on 03/15/22 at  11:00 AM EDT via telecommunication and verified that I am speaking with the correct person using two identifiers. ?  ?Location: ?Patient: Office ?Provider: Office ?  ?  ?THERAPIST PROGRESS NOTE ?  ?Session Time: 11:00AM- 11:55 AM ?  ?Participation Level: Active ?  ?Behavioral Response: Casual and Alert,  Depressed and Anxious ?  ?Type of Therapy: Individual Therapy ?  ?Treatment Goals addressed:   Anxiety / Depression ?  ?Interventions: CBT ?  ?Summary: Tiffany Townsend is a 46 y.o. female who presents with Depression with Anxiety. The OPT therapist worked with the patient for her OPT treatment. The OPT therapist utilized Motivational Interviewing to assist in creating therapeutic repore. The patient in the session was engaged and work in collaboration giving feedback about her triggers and symptoms over the past few weeks.The patient in this session spoke about her her child and Fathers different health situations and the recent impact of family caregiving. The OPT therapist utilized Cognitive Behavioral Therapy through cognitive restructuring as well as worked with the patient on coping strategies to assist in management of mood and Anxiety as well as protective factors. The OPT therapist worked with the patient to assist to regulate her sleep cycle. ?  ?Suicidal/Homicidal: Nowithout intent/plan ?  ?Therapist Response:The OPT therapist worked with the patient for the patients scheduled session. The patient was engaged in her session and gave feedback in relation to triggers, symptoms, and behavior responses over the past few weeks. The OPT therapist worked with the patient utilizing an in session Cognitive Behavioral Therapy exercise. The patient was responsive in the session and verbalized, " I feel tired even though I am getting more sleep".The OPT therapist worked with the patient on coping strategies to manage her mental  health symptoms. The OPT therapist worked with the patient providing ongoing psycho-education. The OPT therapist will continue treatment work with the patient in her next scheduled session. ?  ?Plan: Return again in 2/3 weeks. ?  ?Diagnosis:      Axis I:  Recurrent Moderate Major Depression with Anxiety ? ? Axis II: No diagnosis ?  ?  ?Collaboration of Care: No additional collaboration in this session. ?  ?Patient/Guardian was advised Release of Information must be obtained prior to any record release in order to collaborate their care with an outside provider. Patient/Guardian was advised if they have not already done so to contact the registration department to sign all necessary forms in order for [place] to release information regarding their care.  ?  ?Consent: Patient/Guardian gives verbal consent for treatment and assignment of benefits for services provided during this visit. Patient/Guardian expressed understanding and agreed to proceed.  ?  ?  ?  ?  ?I discussed the assessment and treatment plan with the patient. The patient was provided an opportunity to ask questions and all were answered. The patient agreed with the plan and demonstrated an understanding of the instructions. ?  ?The patient was advised to call back or seek an in-person evaluation if the symptoms worsen or if the condition fails to improve as anticipated. ?  ?I provided 55 minutes of face-to-face time during this encounter. ?  ?Lennox Grumbles, LCSW ?  ?03/15/2022 ?

## 2022-04-02 ENCOUNTER — Encounter: Payer: Self-pay | Admitting: Nurse Practitioner

## 2022-04-02 ENCOUNTER — Ambulatory Visit (INDEPENDENT_AMBULATORY_CARE_PROVIDER_SITE_OTHER): Payer: Managed Care, Other (non HMO) | Admitting: Nurse Practitioner

## 2022-04-02 VITALS — BP 130/88 | HR 91 | Temp 97.3°F | Ht 63.0 in | Wt 226.2 lb

## 2022-04-02 DIAGNOSIS — G43819 Other migraine, intractable, without status migrainosus: Secondary | ICD-10-CM | POA: Diagnosis not present

## 2022-04-02 MED ORDER — NAPROXEN 500 MG PO TABS: 500.0000 mg | ORAL_TABLET | Freq: Two times a day (BID) | ORAL | 1 refills | Status: DC

## 2022-04-02 MED ORDER — RIZATRIPTAN BENZOATE 5 MG PO TABS: 5.0000 mg | ORAL_TABLET | ORAL | 1 refills | Status: DC | PRN

## 2022-04-02 NOTE — Progress Notes (Signed)
? ?Subjective:  ? ? Patient ID: Tiffany Townsend, female    DOB: 10-24-1976, 46 y.o.   MRN: [id number] ? ?HPI ? ?46 year old female patient with history of anxiety, depression presents to clinic with her husband for bad migraine. Has been going off and on for 2-3 months. Has been nauseous, vomiting and light sensitivity.  Patient describes the headaches as pain that goes from the middle of her head towards the back of her head.  Patient states that she has about 2 headaches a week however headaches can last 2 to 3 days.  Headaches are sometimes relieved with rest.  Patient states that she has tried Advil, Tylenol, Excedrin with little to no relief.  Patient denies changes to her vision, tinnitus, numbness, tingling or headaches that are so bad that they wake her from her sleep. ? ?Patient states that headache started about 2 to 3 months ago after she ran out of her Cymbalta.  Patient states that after weeks starting Cymbalta headaches persisted. ? ?Review of Systems  ?Gastrointestinal:  Positive for nausea.  ?Neurological:  Positive for headaches.  ?All other systems reviewed and are negative. ? ?   ?Objective:  ? Physical Exam ?Vitals reviewed.  ?Constitutional:   ?   General: She is not in acute distress. ?   Appearance: Normal appearance. She is obese. She is not ill-appearing, toxic-appearing or diaphoretic.  ?HENT:  ?   Head: Normocephalic and atraumatic.  ?Cardiovascular:  ?   Rate and Rhythm: Normal rate and regular rhythm.  ?   Pulses: Normal pulses.  ?   Heart sounds: Normal heart sounds. No murmur heard. ?Pulmonary:  ?   Effort: Pulmonary effort is normal. No respiratory distress.  ?   Breath sounds: Normal breath sounds. No wheezing.  ?Neurological:  ?   Mental Status: She is alert.  ?   Cranial Nerves: Cranial nerves 2-12 are intact. No cranial nerve deficit, dysarthria or facial asymmetry.  ?   Sensory: Sensation is intact. No sensory deficit.  ?   Motor: Motor function is intact. No weakness, tremor,  atrophy, abnormal muscle tone or seizure activity.  ?   Coordination: Romberg sign positive.  ?   Gait: Tandem walk abnormal. Gait normal.  ?Psychiatric:     ?   Mood and Affect: Mood normal.     ?   Behavior: Behavior normal.  ? ? ? ? ? ?   ?Assessment & Plan:  ? ?1. Other migraine without status migrainosus, intractable ?-Suspect migraine that was possibly triggered by the withdrawal of Cymbalta ?-We will attempt abortive therapy with patient with Maxalt and naproxen ?- rizatriptan (MAXALT) 5 MG tablet; Take 1 tablet (5 mg total) by mouth as needed for migraine. May repeat in 2 hours if needed. Do take more than 30 mg in 24 hours.  Dispense: 15 tablet; Refill: 1 ?- naproxen (NAPROSYN) 500 MG tablet; Take 1 tablet (500 mg total) by mouth 2 (two) times daily with a meal.  Dispense: 68 tablet; Refill: 1 ?- Ambulatory referral to Neurology ?-Due to positive Romberg and abnormal tandem gait will refer patient to neurology for further evaluation ?-Discussed the risk of serotonin syndrome with taking Maxalt and Cymbalta.  Patient informed to monitor self for signs symptoms of serotonin syndrome and stop taking Maxide if she notices hyperreflexia, clonus, hypothermia, mental status changes etc. patient stated understanding ?-Return to clinic if symptoms persist or worsen ? ?  ?Note:  This document was prepared using Systems analyst  and may include unintentional dictation errors. ?Note - This record has been created using [employer].  ?Chart creation errors have been sought, but may not always  ?have been located. Such creation errors do not reflect on  ?the standard of medical care. ? ? ?

## 2022-04-04 ENCOUNTER — Ambulatory Visit (INDEPENDENT_AMBULATORY_CARE_PROVIDER_SITE_OTHER): Payer: 59 | Admitting: Clinical

## 2022-04-04 DIAGNOSIS — F331 Major depressive disorder, recurrent, moderate: Secondary | ICD-10-CM

## 2022-04-04 DIAGNOSIS — F419 Anxiety disorder, unspecified: Secondary | ICD-10-CM

## 2022-04-04 NOTE — Progress Notes (Signed)
Virtual Visit via Telephone Note   I connected with Tiffany Townsend on 04/04/22 at  1:00 PM EDT via telecommunication and verified that I am speaking with the correct person using two identifiers.   Location: Patient: Office Provider: Office     THERAPIST PROGRESS NOTE   Session Time: 1:00 PM- 1:45 PM   Participation Level: Active   Behavioral Response: Casual and Alert,  Depressed and Anxious   Type of Therapy: Individual Therapy   Treatment Goals addressed:   Anxiety / Depression   Interventions: CBT   Summary: Tiffany Townsend is a 46 y.o. female who presents with Depression with Anxiety. The OPT therapist worked with the patient for her OPT treatment. The OPT therapist utilized Motivational Interviewing to assist in creating therapeutic repore. The patient in the session was engaged and work in collaboration giving feedback about her triggers and symptoms over the past few weeks.The patient in this session spoke about her interactions with family recently and her desire as well as reluctance to work to rebuild relationships with certain family members who have recently reached out to the patient to rebuild their relationships with the patient. The OPT therapist utilized Cognitive Behavioral Therapy through cognitive restructuring as well as worked with the patient on coping strategies to assist in management of mood and Anxiety as well as protective factors. The OPT therapist worked with the patient on decision making, communication, and utilizing her current support system.   Suicidal/Homicidal: Nowithout intent/plan   Therapist Response:The OPT therapist worked with the patient for the patients scheduled session. The patient was engaged in her session and gave feedback in relation to triggers, symptoms, and behavior responses over the past few weeks. The OPT therapist worked with the patient utilizing an in session Cognitive Behavioral Therapy exercise. The patient was responsive in  the session and verbalized, " I feel like I want to make some changes and find myself I have difficulty with my confidence and ability to make my own decisions without second guessing or asking others opinions".The OPT therapist worked with the patient on coping strategies to manage her mental health symptoms. The OPT therapist worked with the patient providing ongoing psycho-education. The OPT therapist assigned homework to the patient in relation to decision making on things that are not large scale potentially large impact things to assist in building the patients confidence. The OPT therapist will continue treatment work with the patient in her next scheduled session.   Plan: Return again in 2/3 weeks.   Diagnosis:      Axis I:  Recurrent Moderate Major Depression with Anxiety    Axis II: No diagnosis     Collaboration of Care: No additional collaboration in this session.   Patient/Guardian was advised Release of Information must be obtained prior to any record release in order to collaborate their care with an outside provider. Patient/Guardian was advised if they have not already done so to contact the registration department to sign all necessary forms in order for [place] to release information regarding their care.    Consent: Patient/Guardian gives verbal consent for treatment and assignment of benefits for services provided during this visit. Patient/Guardian expressed understanding and agreed to proceed.          I discussed the assessment and treatment plan with the patient. The patient was provided an opportunity to ask questions and all were answered. The patient agreed with the plan and demonstrated an understanding of the instructions.   The patient was advised  to call back or seek an in-person evaluation if the symptoms worsen or if the condition fails to improve as anticipated.   I provided 45 minutes of face-to-face time during this encounter.   Lennox Grumbles, LCSW    04/04/2022

## 2023-02-07 ENCOUNTER — Other Ambulatory Visit: Payer: Self-pay | Admitting: Family Medicine

## 2023-02-12 NOTE — Telephone Encounter (Signed)
Needs office visit may have 1 refill

## 2023-02-17 ENCOUNTER — Other Ambulatory Visit: Payer: Self-pay | Admitting: Family Medicine

## 2023-05-28 ENCOUNTER — Other Ambulatory Visit: Payer: Self-pay | Admitting: Family Medicine

## 2023-05-29 ENCOUNTER — Telehealth: Payer: Self-pay

## 2023-05-29 ENCOUNTER — Other Ambulatory Visit: Payer: Self-pay

## 2023-05-29 NOTE — Telephone Encounter (Signed)
PT set appt for 07/30. Pt would like medication filled until then.

## 2023-05-29 NOTE — Telephone Encounter (Signed)
Cymbalta, may have 30 capsules 1 daily, please keep follow-up at the end of the month as scheduled

## 2023-05-30 ENCOUNTER — Other Ambulatory Visit: Payer: Self-pay

## 2023-05-30 MED ORDER — DULOXETINE HCL 60 MG PO CPEP: 60.0000 mg | ORAL_CAPSULE | Freq: Every day | ORAL | 0 refills | Status: DC

## 2023-06-17 ENCOUNTER — Encounter: Payer: Self-pay | Admitting: Family Medicine

## 2023-06-17 ENCOUNTER — Ambulatory Visit: Payer: Managed Care, Other (non HMO) | Admitting: Family Medicine

## 2023-06-17 VITALS — BP 134/74 | HR 108 | Temp 97.2°F | Ht 63.0 in | Wt 228.0 lb

## 2023-06-17 DIAGNOSIS — F411 Generalized anxiety disorder: Secondary | ICD-10-CM | POA: Diagnosis not present

## 2023-06-17 DIAGNOSIS — R748 Abnormal levels of other serum enzymes: Secondary | ICD-10-CM

## 2023-06-17 DIAGNOSIS — E785 Hyperlipidemia, unspecified: Secondary | ICD-10-CM | POA: Diagnosis not present

## 2023-06-17 DIAGNOSIS — R7301 Impaired fasting glucose: Secondary | ICD-10-CM | POA: Diagnosis not present

## 2023-06-17 MED ORDER — DULOXETINE HCL 60 MG PO CPEP: 60.0000 mg | ORAL_CAPSULE | Freq: Every day | ORAL | 1 refills | Status: DC

## 2023-06-17 NOTE — Progress Notes (Signed)
   Subjective:    Patient ID: Tiffany Townsend, female    DOB: [birth year], 47 y.o.   MRN: [id number]  HPI Anxiety and depression follow up no concerns voiced  Moderate anxiety and depression She is doing the best she can She recently lost her father and her husband lost his mother Patient denies being suicidal Review of Systems     Objective:   Physical Exam  General-in no acute distress Eyes-no discharge Lungs-respiratory rate normal, CTA CV-no murmurs,RRR Extremities skin warm dry no edema Neuro grossly normal Behavior normal, alert  Outpatient Encounter Medications as of 06/17/2023  Medication Sig   guanFACINE (INTUNIV) 1 MG TB24 ER tablet Take 1 mg by mouth at bedtime.   naproxen (NAPROSYN) 500 MG tablet Take 1 tablet (500 mg total) by mouth 2 (two) times daily with a meal.   rizatriptan (MAXALT) 5 MG tablet Take 1 tablet (5 mg total) by mouth as needed for migraine. May repeat in 2 hours if needed. Do take more than 30 mg in 24 hours.   [DISCONTINUED] DULoxetine (CYMBALTA) 60 MG capsule Take 1 capsule (60 mg total) by mouth daily.   DULoxetine (CYMBALTA) 60 MG capsule Take 1 capsule (60 mg total) by mouth daily.   [DISCONTINUED] hydrOXYzine (ATARAX) 25 MG tablet 1 to 2 qhs prn insomnia (Patient not taking: Reported on 04/02/2022)   No facility-administered encounter medications on file as of 06/17/2023.         Assessment & Plan:  1. Generalized anxiety disorder Continue current medications. Cymbalta seems to be helping 2. Hyperlipidemia, unspecified hyperlipidemia type Healthy diet check labs - Lipid Panel  3. Elevated liver enzymes History of fatty liver check labs - Hepatic Function Panel  4. Elevated fasting glucose Screening glucose - Glucose, Random  Follow-up within 5 to 6 months

## 2023-06-27 ENCOUNTER — Telehealth: Payer: Self-pay | Admitting: Family Medicine

## 2023-06-27 NOTE — Telephone Encounter (Signed)
Patient would like results of recent test done. It was sent to my chart but she doesn't remember password. Call her at [phone]

## 2023-06-30 ENCOUNTER — Telehealth: Payer: Self-pay

## 2023-06-30 ENCOUNTER — Other Ambulatory Visit: Payer: Self-pay | Admitting: Family Medicine

## 2023-06-30 MED ORDER — DULOXETINE HCL 60 MG PO CPEP: 60.0000 mg | ORAL_CAPSULE | Freq: Every day | ORAL | 1 refills | Status: DC

## 2023-06-30 NOTE — Telephone Encounter (Signed)
Called and Mother patient answered phone. Mother of patient informed to have her daughter call office. Nurse Note* find out which test she wants results on? Help patient reset her MyChart.

## 2023-06-30 NOTE — Telephone Encounter (Signed)
Please note I had sent this prescription in previously That was at 1 time she came by for office visit approximately in July I resent the prescription today so therefore they should have it   if they still state they do not have it please have her let me know so I can call her pharmacy

## 2023-06-30 NOTE — Telephone Encounter (Signed)
Nurse Note*Password has been reset to [facility] for patient's MyChart notify her to reset another password when she is able to get in MyChart.

## 2023-06-30 NOTE — Telephone Encounter (Signed)
See telephone message

## 2023-06-30 NOTE — Telephone Encounter (Signed)
See MyChart message

## 2023-06-30 NOTE — Telephone Encounter (Signed)
Prescription Request  06/30/2023  LOV: Visit date not found  What is the name of the medication or equipment? DULoxetine (CYMBALTA) 60 MG capsule   Have you contacted your pharmacy to request a refill? Yes   Which pharmacy would you like this sent to?  [facility] - [place] - [address] [place] [address] Phone: [phone] Fax: [phone]    Patient notified that their request is being sent to the clinical staff for review and that they should receive a response within 2 business days.   Please advise at Mobile [phone] (mobile)

## 2023-06-30 NOTE — Telephone Encounter (Signed)
Results discussed with patient. Patient advised per Dr. Mervin Kung Anadalay-your cholesterol profile looks mildly better than what it did a year ago.  LDL-bad cholesterol-slightly but not worrisome.  Healthy diet is the best approach.  Your random glucose was elevated.  I recommend minimizing starches in the diet avoid sugary drinks stay physically active.  It would be wise to read look at all of this again in 6 months.  Please follow-up at that time.  Please take care-Dr. Lilyan Punt  Patient verbalized understanding. Patient's password reset for MyChart.

## 2023-12-28 ENCOUNTER — Other Ambulatory Visit: Payer: Self-pay | Admitting: Family Medicine

## 2023-12-31 ENCOUNTER — Ambulatory Visit: Payer: Managed Care, Other (non HMO) | Admitting: Family Medicine

## 2024-01-14 ENCOUNTER — Ambulatory Visit: Payer: Managed Care, Other (non HMO) | Admitting: Family Medicine

## 2024-01-14 ENCOUNTER — Encounter: Payer: Self-pay | Admitting: Family Medicine

## 2024-01-14 VITALS — BP 122/86 | HR 87 | Temp 97.2°F | Ht 63.0 in | Wt 232.0 lb

## 2024-01-14 DIAGNOSIS — F411 Generalized anxiety disorder: Secondary | ICD-10-CM | POA: Diagnosis not present

## 2024-01-14 DIAGNOSIS — H9202 Otalgia, left ear: Secondary | ICD-10-CM

## 2024-01-14 DIAGNOSIS — R7301 Impaired fasting glucose: Secondary | ICD-10-CM | POA: Diagnosis not present

## 2024-01-14 NOTE — Progress Notes (Signed)
   Subjective:    Patient ID: Tiffany Townsend, female    DOB: 03/03/1976, 48 y.o.   MRN: [id number]  HPI Patient having perimenopausal symptoms hot flashes sweats intermittent but she is dealing with this this been going on for several months  She does have underlying anxiety and worry but she states it is under much better control with the Cymbalta as she is continuing this  She does try to keep physically active and trying to eat healthy did try to keep her weight down  She does relate intermittent ear pain over the past several months where it feels like a deep ache in her left ear no hearing problems Review of Systems     Objective:   Physical Exam General-in no acute distress Eyes-no discharge Lungs-respiratory rate normal, CTA CV-no murmurs,RRR Extremities skin warm dry no edema Neuro grossly normal Behavior normal, alert Runs are normal she denies TMJ symptoms but does state at times it aches in that area no crepitus noted no masses noted       Assessment & Plan:  1. Left ear pain (Primary) She has had left ear pain intermittently the past several months referral to ENT evaluation and today's office visit does not appear to have any obvious findings - Ambulatory referral to ENT  2. Fasting hyperglycemia Check lab work - Hemoglobin A1c - Basic metabolic panel  3. Generalized anxiety disorder Continue Cymbalta She is doing well overall if any setbacks notify us follow-up in 6 months  4. Morbid obesity (HCC) Portion control regular physical activity

## 2024-01-15 ENCOUNTER — Telehealth: Payer: Self-pay

## 2024-01-15 ENCOUNTER — Encounter: Payer: Self-pay | Admitting: Family Medicine

## 2024-01-15 LAB — HEMOGLOBIN A1C
Est. average glucose Bld gHb Est-mCnc: 108 mg/dL
Hgb A1c MFr Bld: 5.4 % (ref 4.8–5.6)

## 2024-01-15 LAB — BASIC METABOLIC PANEL
BUN/Creatinine Ratio: 17 (ref 9–23)
BUN: 14 mg/dL (ref 6–24)
CO2: 20 mmol/L (ref 20–29)
Calcium: 9.6 mg/dL (ref 8.7–10.2)
Chloride: 102 mmol/L (ref 96–106)
Creatinine, Ser: 0.84 mg/dL (ref 0.57–1.00)
Glucose: 102 mg/dL — ABNORMAL HIGH (ref 70–99)
Potassium: 4.6 mmol/L (ref 3.5–5.2)
Sodium: 137 mmol/L (ref 134–144)
eGFR: 86 mL/min/{1.73_m2} (ref >59)

## 2024-01-15 NOTE — Telephone Encounter (Signed)
 Communication  Reason for CRM: Patient given lab results. Patient verbalized understanding.

## 2024-03-26 ENCOUNTER — Other Ambulatory Visit: Payer: Self-pay | Admitting: Family Medicine

## 2024-07-13 ENCOUNTER — Ambulatory Visit: Payer: Managed Care, Other (non HMO) | Admitting: Family Medicine

## 2024-08-23 ENCOUNTER — Ambulatory Visit: Admitting: Family Medicine

## 2024-08-23 VITALS — BP 122/89 | HR 112 | Ht 63.0 in | Wt 236.0 lb

## 2024-08-23 DIAGNOSIS — F321 Major depressive disorder, single episode, moderate: Secondary | ICD-10-CM | POA: Diagnosis not present

## 2024-08-23 DIAGNOSIS — F411 Generalized anxiety disorder: Secondary | ICD-10-CM

## 2024-08-23 MED ORDER — DULOXETINE HCL 60 MG PO CPEP: 60.0000 mg | ORAL_CAPSULE | Freq: Every day | ORAL | 1 refills | Status: AC

## 2024-08-23 MED ORDER — BUSPIRONE HCL 10 MG PO TABS: 10.0000 mg | ORAL_TABLET | Freq: Two times a day (BID) | ORAL | 4 refills | Status: AC

## 2024-08-23 NOTE — Progress Notes (Signed)
   Subjective:    Patient ID: Tiffany Townsend, female    DOB: Sep 15, 1976, 48 y.o.   MRN: [id number]  HPI  6 month follow up  Med refill  Patient dealing with a lot of sadness and to some degree anxiety related into the loss of her dad from last year Patient herself overall feels pretty good not suicidal.  We did discuss counseling she states she has tried that in the past that did not really seem to help much generalized anxiety disorder    01/14/2024    1:40 PM 06/17/2023    3:05 PM 01/16/2022    9:09 AM 09/08/2020    7:26 AM  GAD 7 : Generalized Anxiety Score  Nervous, Anxious, on Edge 0 0  2  Control/stop worrying 0 1  3  Worry too much - different things 0 1  3  Trouble relaxing 0 2  2  Restless 0 1  0  Easily annoyed or irritable 0 2  1  Afraid - awful might happen 1 2  0  Total GAD 7 Score 1 9  11   Anxiety Difficulty Not difficult at all Somewhat difficult  Very difficult     Information is confidential and restricted. Go to Review Flowsheets to unlock data.       08/23/2024    2:46 PM 01/14/2024    1:40 PM 06/17/2023    3:04 PM 01/16/2022    9:10 AM 12/26/2021    9:34 PM  Depression screen PHQ 2/9  Decreased Interest 3 2 1  3   Down, Depressed, Hopeless 2 0 2  3  PHQ - 2 Score 5 2 3  6   Altered sleeping 3 0 0  2  Tired, decreased energy 2 1 1  3   Change in appetite 0 3 2  3   Feeling bad or failure about yourself  1 0 2  1  Trouble concentrating 2 2 2   0  Moving slowly or fidgety/restless 0 0 0  0  Suicidal thoughts 0 0 0  0  PHQ-9 Score 13 8 10  15   Difficult doing work/chores Somewhat difficult Somewhat difficult Somewhat difficult  Somewhat difficult     Information is confidential and restricted. Go to Review Flowsheets to unlock data.     Review of Systems     Objective:   Physical Exam  General-in no acute distress Eyes-no discharge Lungs-respiratory rate normal, CTA CV-no murmurs,RRR Extremities skin warm dry no edema Neuro grossly normal Behavior  normal, alert       Assessment & Plan:  1. Depression, major, single episode, moderate (HCC) (Primary) She has tried numerous medicines in the past including Celexa , Prozac , sertraline  She is currently on Cymbalta  60 mg a day and is not recommended to go above that dose without really seeing any additional benefit Problem right now she would like to defer on counseling   2. Generalized anxiety disorder I think is reasonable to try BuSpar 10 mg each evening for the first 4 to 5 days then after that 1 twice daily If any ongoing troubles to follow-up  To send us  a MyChart message in approximately 3 to 4 weeks how this is doing for her Consideration for counseling and behavioral health referral if worsening illness  Follow-up within 6 months

## 2024-09-29 ENCOUNTER — Ambulatory Visit: Admitting: Family Medicine

## 2024-09-29 ENCOUNTER — Encounter: Payer: Self-pay | Admitting: Family Medicine

## 2024-09-29 VITALS — BP 139/89 | HR 116 | Temp 98.5°F | Ht 63.0 in | Wt 233.6 lb

## 2024-09-29 DIAGNOSIS — N951 Menopausal and female climacteric states: Secondary | ICD-10-CM | POA: Diagnosis not present

## 2024-09-29 DIAGNOSIS — R252 Cramp and spasm: Secondary | ICD-10-CM | POA: Diagnosis not present

## 2024-09-29 DIAGNOSIS — Z1231 Encounter for screening mammogram for malignant neoplasm of breast: Secondary | ICD-10-CM | POA: Diagnosis not present

## 2024-09-29 DIAGNOSIS — F324 Major depressive disorder, single episode, in partial remission: Secondary | ICD-10-CM | POA: Diagnosis not present

## 2024-09-29 NOTE — Progress Notes (Signed)
   Subjective:    Patient ID: Tiffany Townsend, female    DOB: 1976/04/16, 48 y.o.   MRN: [id number]  HPI Patient has history of depression as well as anxiety recently we started a medication to try to help her with her anxiety she did have an irregular cycle she wondered if that could be the cause According to studies this medication BuSpar can cause irregular cycles less than 1% of the time so more than likely it is perimenopause we did discuss as she is having hot flashes She is trying to be healthy with her habits She is trying to utilize various techniques to help her with anxiety She feels the depression part is improving    09/29/2024    3:41 PM 08/23/2024    2:46 PM 01/14/2024    1:40 PM 06/17/2023    3:04 PM 01/16/2022    9:10 AM  Depression screen PHQ 2/9  Decreased Interest 2 3 2 1    Down, Depressed, Hopeless 2 2 0 2   PHQ - 2 Score 4 5 2 3    Altered sleeping 3 3 0 0   Tired, decreased energy 2 2 1 1    Change in appetite 3 0 3 2   Feeling bad or failure about yourself  2 1 0 2   Trouble concentrating 1 2 2 2    Moving slowly or fidgety/restless 0 0 0 0   Suicidal thoughts 0 0 0 0   PHQ-9 Score 15 13  8  10     Difficult doing work/chores  Somewhat difficult Somewhat difficult Somewhat difficult      Information is confidential and restricted. Go to Review Flowsheets to unlock data.   Data saved with a previous flowsheet row definition       Review of Systems     Objective:   Physical Exam  General-in no acute distress Eyes-no discharge Lungs-respiratory rate normal, CTA CV-no murmurs,RRR Extremities skin warm dry no edema Neuro grossly normal Behavior normal, alert       Assessment & Plan:  History of depression Doing better Taking her medication Anxiety still causing significant trouble she is tolerating things better BuSpar twice daily Follow-up by spring lab work, springtime  Perimenopause-this was discussed in detail and when this would signify  menopause  Leg cramps-stretching exercises if persistent lab work

## 2024-12-02 ENCOUNTER — Other Ambulatory Visit (HOSPITAL_COMMUNITY): Payer: Self-pay | Admitting: Family Medicine

## 2024-12-02 DIAGNOSIS — N63 Unspecified lump in unspecified breast: Secondary | ICD-10-CM

## 2024-12-02 DIAGNOSIS — N6320 Unspecified lump in the left breast, unspecified quadrant: Secondary | ICD-10-CM

## 2024-12-21 ENCOUNTER — Ambulatory Visit (HOSPITAL_COMMUNITY)
Admission: RE | Admit: 2024-12-21 | Discharge: 2024-12-21 | Disposition: A | Source: Ambulatory Visit | Attending: Family Medicine | Admitting: Family Medicine

## 2024-12-21 ENCOUNTER — Encounter (HOSPITAL_COMMUNITY): Payer: Self-pay

## 2024-12-21 DIAGNOSIS — N63 Unspecified lump in unspecified breast: Secondary | ICD-10-CM

## 2024-12-21 DIAGNOSIS — N6320 Unspecified lump in the left breast, unspecified quadrant: Secondary | ICD-10-CM

## 2025-01-11 ENCOUNTER — Other Ambulatory Visit (HOSPITAL_COMMUNITY)

## 2025-01-11 ENCOUNTER — Encounter (HOSPITAL_COMMUNITY)

## 2025-03-31 ENCOUNTER — Ambulatory Visit: Admitting: Family Medicine
